# Patient Record
Sex: Male | Born: 1950 | Race: White | Hispanic: No | Marital: Married | Smoking: Never smoker
Health system: Southern US, Community
[De-identification: ages and names within clinical notes are randomized; demographics above are authoritative.]

## PROBLEM LIST (undated history)

## (undated) DIAGNOSIS — N529 Male erectile dysfunction, unspecified: Secondary | ICD-10-CM

## (undated) DIAGNOSIS — I10 Essential (primary) hypertension: Secondary | ICD-10-CM

## (undated) DIAGNOSIS — R911 Solitary pulmonary nodule: Secondary | ICD-10-CM

## (undated) DIAGNOSIS — Z9289 Personal history of other medical treatment: Secondary | ICD-10-CM

## (undated) DIAGNOSIS — I77810 Thoracic aortic ectasia: Secondary | ICD-10-CM

## (undated) DIAGNOSIS — E7800 Pure hypercholesterolemia, unspecified: Secondary | ICD-10-CM

## (undated) DIAGNOSIS — R079 Chest pain, unspecified: Secondary | ICD-10-CM

## (undated) DIAGNOSIS — I2510 Atherosclerotic heart disease of native coronary artery without angina pectoris: Secondary | ICD-10-CM

## (undated) DIAGNOSIS — I77819 Aortic ectasia, unspecified site: Secondary | ICD-10-CM

## (undated) DIAGNOSIS — M109 Gout, unspecified: Secondary | ICD-10-CM

## (undated) DIAGNOSIS — I452 Bifascicular block: Secondary | ICD-10-CM

## (undated) DIAGNOSIS — I700 Atherosclerosis of aorta: Secondary | ICD-10-CM

## (undated) HISTORY — DX: Atherosclerosis of aorta: I70.0

## (undated) HISTORY — DX: Personal history of other medical treatment: Z92.89

## (undated) HISTORY — DX: Gout, unspecified: M10.9

## (undated) HISTORY — DX: Aortic ectasia, unspecified site: I77.819

## (undated) HISTORY — DX: Male erectile dysfunction, unspecified: N52.9

## (undated) HISTORY — DX: Essential (primary) hypertension: I10

## (undated) HISTORY — DX: Atherosclerotic heart disease of native coronary artery without angina pectoris: I25.10

## (undated) HISTORY — DX: Thoracic aortic ectasia: I77.810

## (undated) HISTORY — DX: Solitary pulmonary nodule: R91.1

## (undated) HISTORY — DX: Chest pain, unspecified: R07.9

## (undated) HISTORY — DX: Pure hypercholesterolemia, unspecified: E78.00

## (undated) HISTORY — DX: Bifascicular block: I45.2

---

## 2006-05-30 ENCOUNTER — Ambulatory Visit (HOSPITAL_BASED_OUTPATIENT_CLINIC_OR_DEPARTMENT_OTHER): Admission: RE | Admit: 2006-05-30 | Discharge: 2006-05-30 | Payer: Self-pay | Admitting: Urology

## 2006-06-10 ENCOUNTER — Emergency Department (HOSPITAL_COMMUNITY): Admission: EM | Admit: 2006-06-10 | Discharge: 2006-06-10 | Payer: Self-pay | Admitting: Emergency Medicine

## 2013-09-29 ENCOUNTER — Other Ambulatory Visit: Payer: Self-pay | Admitting: Cardiology

## 2013-10-30 ENCOUNTER — Ambulatory Visit: Payer: Self-pay | Admitting: Cardiology

## 2013-11-17 ENCOUNTER — Encounter: Payer: Self-pay | Admitting: General Surgery

## 2013-11-17 DIAGNOSIS — I10 Essential (primary) hypertension: Secondary | ICD-10-CM

## 2013-11-24 ENCOUNTER — Encounter: Payer: Self-pay | Admitting: *Deleted

## 2013-12-16 ENCOUNTER — Ambulatory Visit (INDEPENDENT_AMBULATORY_CARE_PROVIDER_SITE_OTHER): Payer: Medicare Other | Admitting: Cardiology

## 2013-12-16 VITALS — BP 152/85 | HR 61 | Ht 72.0 in | Wt 279.4 lb

## 2013-12-16 DIAGNOSIS — I10 Essential (primary) hypertension: Secondary | ICD-10-CM

## 2013-12-16 NOTE — Patient Instructions (Signed)
Your physician recommends that you continue on your current medications as directed. Please refer to the Current Medication list given to you today.  Your physician wants you to follow-up in: 1 year with Dr. Turner. You will receive a reminder letter in the mail two months in advance. If you don't receive a letter, please call our office to schedule the follow-up appointment.  

## 2013-12-16 NOTE — Progress Notes (Signed)
  [address] [place], [place]  [address] Phone: [phone] Fax:  [phone]  Date:  12/16/2013   ID:  Anthony Malone, DOB May 29, 1950, MRN [id number]  PCP:  Darrow BussingKOIRALA,DIBAS, MD  Cardiologist:  Armanda Magicraci Turner, MD     History of Present Illness: Anthony Malone is a 63 y.o. male with history of HTN and chest pain in the past with normal cardiac workup who presents today for followup of HTN.  He is doing well.  He denies any chest pain, SOB, DOE, dizziness, palpitations or syncope.  Occasionally he will have some mild LE edema when he travels.   Wt Readings from Last 3 Encounters:  12/16/13 279 lb 6.4 oz (126.735 kg)     Past Medical History  Diagnosis Date  . Hypertension   . Gout   . High cholesterol   . ED (erectile dysfunction)   . History of EKG     LAFB  . Chest pain     Dr Mayford Knifeurner- normal echo-no ischemia on nuclear stress 6/12    Current Outpatient Prescriptions  Medication Sig Dispense Refill  . allopurinol (ZYLOPRIM) 300 MG tablet Take 300 mg by mouth daily.      Marland Kitchen. amLODipine (NORVASC) 5 MG tablet TAKE 1 TABLET DAILY  90 tablet  0  . aspirin 81 MG tablet Take 81 mg by mouth daily.      . hydrochlorothiazide (HYDRODIURIL) 25 MG tablet Take 25 mg by mouth daily.      Marland Kitchen. losartan (COZAAR) 100 MG tablet TAKE 1 TABLET DAILY  90 tablet  0  . metoprolol succinate (TOPROL-XL) 50 MG 24 hr tablet Take 50 mg by mouth daily. Take with or immediately following a meal.      . Multiple Vitamin (MULTIVITAMIN) tablet Take 1 tablet by mouth daily.      . naproxen (NAPROSYN) 500 MG tablet Take 500 mg by mouth 2 (two) times daily with a meal. As  Needed for joint pain with food       No current facility-administered medications for this visit.    Allergies:    Allergies  Allergen Reactions  . Ibuprofen Rash    High dosages causes rash    Social History:  The patient  reports that he has never smoked. He does not have any smokeless tobacco history on file. He reports  that he drinks alcohol. He reports that he does not use illicit drugs.   Family History:  The patient's family history includes Breast cancer in his mother; CVA in his father; Hypertension in his father and mother.   ROS:  Please see the history of present illness.      All other systems reviewed and negative.   PHYSICAL EXAM: VS:  BP 152/85  Pulse 61  Ht 6' (1.829 m)  Wt 279 lb 6.4 oz (126.735 kg)  BMI 37.89 kg/m2 Well nourished, well developed, in no acute distress HEENT: normal Neck: no JVD Cardiac:  normal S1, S2; RRR; no murmur Lungs:  clear to auscultation bilaterally, no wheezing, rhonchi or rales Abd: soft, nontender, no hepatomegaly Ext: no edema Skin: warm and dry Neuro:  CNs 2-12 intact, no focal abnormalities noted  EKG:    NSR with LAFB  ASSESSMENT AND PLAN:  1. HTN elevated today but this am at home was 137/4275mmHg.  It usually runs 120/70's at home. - continue amlodipine/Losartan/metoprolol/HCTZ  Followup with me in 1 year   Signed, Armanda Magicraci Turner, MD 12/16/2013 8:26 AM

## 2013-12-28 ENCOUNTER — Other Ambulatory Visit: Payer: Self-pay | Admitting: Cardiology

## 2014-01-12 ENCOUNTER — Other Ambulatory Visit: Payer: Self-pay | Admitting: Cardiology

## 2014-06-20 ENCOUNTER — Other Ambulatory Visit: Payer: Self-pay | Admitting: Cardiology

## 2014-09-18 ENCOUNTER — Encounter: Payer: Self-pay | Admitting: Cardiology

## 2014-12-24 ENCOUNTER — Encounter: Payer: Self-pay | Admitting: Cardiology

## 2014-12-24 ENCOUNTER — Ambulatory Visit (INDEPENDENT_AMBULATORY_CARE_PROVIDER_SITE_OTHER): Payer: Medicare Other | Admitting: Cardiology

## 2014-12-24 VITALS — BP 140/82 | HR 50 | Ht 73.0 in | Wt 284.8 lb

## 2014-12-24 DIAGNOSIS — R0602 Shortness of breath: Secondary | ICD-10-CM | POA: Diagnosis not present

## 2014-12-24 DIAGNOSIS — I10 Essential (primary) hypertension: Secondary | ICD-10-CM | POA: Diagnosis not present

## 2014-12-24 DIAGNOSIS — T733XXD Exhaustion due to excessive exertion, subsequent encounter: Secondary | ICD-10-CM | POA: Diagnosis not present

## 2014-12-24 DIAGNOSIS — T733XXA Exhaustion due to excessive exertion, initial encounter: Secondary | ICD-10-CM | POA: Insufficient documentation

## 2014-12-24 NOTE — Patient Instructions (Signed)
Medication Instructions:  Your physician recommends that you continue on your current medications as directed. Please refer to the Current Medication list given to you today.   Labwork: None  Testing/Procedures: Dr. Turner recommends you have an EXERCISE MYOVIEW.  Follow-Up: Your physician wants you to follow-up in: 1 year with Dr. Turner. You will receive a reminder letter in the mail two months in advance. If you don't receive a letter, please call our office to schedule the follow-up appointment.   Any Other Special Instructions Will Be Listed Below (If Applicable).   

## 2014-12-24 NOTE — Progress Notes (Signed)
Cardiology Office Note   Date:  12/24/2014   ID:  Anthony Malone, DOB 27-Jul-1950, MRN [id number]  PCP:  Darrow Bussing, MD    Chief Complaint  Patient presents with  . Follow-up    HTN      History of Present Illness:  64 y.o. male with history of HTN and chest pain in the past with normal cardiac workup who presents today for followup of HTN. He is doing well. He denies any chest pain, dizziness, palpitations or syncope. Occasionally he will have some mild LE edema when he travels.  He says that sometimes when he is outside working, when he comes in he will feel very fatigued and will feel a lump in his throat. He also gets DOE with exertion.      Past Medical History  Diagnosis Date  . Hypertension   . Gout   . High cholesterol   . ED (erectile dysfunction)   . History of EKG     LAFB  . Chest pain     Dr Mayford Knife- normal echo-no ischemia on nuclear stress 6/12    No past surgical history on file.   Current Outpatient Prescriptions  Medication Sig Dispense Refill  . allopurinol (ZYLOPRIM) 300 MG tablet Take 300 mg by mouth daily.    Marland Kitchen amLODipine (NORVASC) 5 MG tablet TAKE 1 TABLET DAILY 90 tablet 1  . aspirin 81 MG tablet Take 81 mg by mouth daily.    . hydrochlorothiazide (HYDRODIURIL) 25 MG tablet Take 25 mg by mouth daily.    Marland Kitchen losartan (COZAAR) 100 MG tablet TAKE 1 TABLET DAILY 90 tablet 1  . metoprolol succinate (TOPROL-XL) 50 MG 24 hr tablet Take 50 mg by mouth daily. Take with or immediately following a meal.    . Multiple Vitamin (MULTIVITAMIN) tablet Take 1 tablet by mouth daily.    . naproxen (NAPROSYN) 500 MG tablet Take 500 mg by mouth as needed. As  Needed for joint pain with food     No current facility-administered medications for this visit.    Allergies:   Ibuprofen    Social History:  The patient  reports that he has never smoked. He does not have any smokeless tobacco history on file. He reports that he drinks alcohol. He  reports that he does not use illicit drugs.   Family History:  The patient's family history includes Breast cancer in his mother; CVA in his father; Hypertension in his father and mother.    ROS:  Please see the history of present illness.   Otherwise, review of systems are positive for none.   All other systems are reviewed and negative.    PHYSICAL EXAM: VS:  BP 140/82 mmHg  Pulse 50  Ht 6\' 1"  (1.854 m)  Wt 284 lb 12.8 oz (129.184 kg)  BMI 37.58 kg/m2  SpO2 96% , BMI Body mass index is 37.58 kg/(m^2). GEN: Well nourished, well developed, in no acute distress HEENT: normal Neck: no JVD, carotid bruits, or masses Cardiac: RRR; no murmurs, rubs, or gallops,no edema  Respiratory:  clear to auscultation bilaterally, normal work of breathing GI: soft, nontender, nondistended, + BS MS: no deformity or atrophy Skin: warm and dry, no rash Neuro:  Strength and sensation are intact Psych: euthymic mood, full affect   EKG:  EKG is ordered today. The ekg ordered today demonstrates sinus bradycardia at 50bpm with LAD, LAFB and  nonspecific IVCD   Recent Labs: No results found for requested labs within last 365 days.    Lipid Panel No results found for: CHOL, TRIG, HDL, CHOLHDL, VLDL, LDLCALC, LDLDIRECT    Wt Readings from Last 3 Encounters:  12/24/14 284 lb 12.8 oz (129.184 kg)  12/16/13 279 lb 6.4 oz (126.735 kg)      ASSESSMENT AND PLAN:  1. HTN - controlled - continue amlodipine/Losartan/metoprolol/HCTZ 2.  DOE which could be related to obesity 3.  Exertional fatigue - I will get a stress myoview to rule out ischemia  Current medicines are reviewed at length with the patient today.  The patient does not have concerns regarding medicines.  The following changes have been made:  no change  Labs/ tests ordered today: See above Assessment and Plan  Orders Placed This Encounter  Procedures  . Myocardial Perfusion Imaging  . EKG 12-Lead     Disposition:   FU with me  in 1 year  Signed, Quintella Reichert, MD  12/24/2014 9:01 AM    [facility] [address] [place], [place], [place]  [address] Phone: [phone]; Fax: [phone]

## 2014-12-30 ENCOUNTER — Telehealth (HOSPITAL_COMMUNITY): Payer: Self-pay

## 2014-12-30 ENCOUNTER — Telehealth (HOSPITAL_COMMUNITY): Payer: Self-pay | Admitting: *Deleted

## 2014-12-30 NOTE — Telephone Encounter (Signed)
Patient given detailed instructions per Myocardial Perfusion Study Information Sheet for test on 01/05/15 at 0930. Patient Notified to arrive 15 minutes early, and that it is imperative to arrive on time for appointment to keep from having the test rescheduled. Patient verbalized understanding. Tekela Garguilo, Adelene Idler

## 2014-12-30 NOTE — Telephone Encounter (Signed)
Left message on voicemail in reference to upcoming appointment scheduled for 01-05-2015. Phone number given for a call back so details instructions can be given. Anthony Malone A   

## 2015-01-05 ENCOUNTER — Ambulatory Visit (HOSPITAL_COMMUNITY): Payer: Medicare Other | Attending: Cardiology

## 2015-01-05 DIAGNOSIS — R0602 Shortness of breath: Secondary | ICD-10-CM | POA: Diagnosis present

## 2015-01-05 DIAGNOSIS — T733XXD Exhaustion due to excessive exertion, subsequent encounter: Secondary | ICD-10-CM

## 2015-01-05 DIAGNOSIS — I517 Cardiomegaly: Secondary | ICD-10-CM | POA: Diagnosis not present

## 2015-01-05 DIAGNOSIS — I10 Essential (primary) hypertension: Secondary | ICD-10-CM | POA: Diagnosis not present

## 2015-01-05 DIAGNOSIS — R002 Palpitations: Secondary | ICD-10-CM | POA: Insufficient documentation

## 2015-01-05 DIAGNOSIS — R5383 Other fatigue: Secondary | ICD-10-CM | POA: Insufficient documentation

## 2015-01-05 MED ORDER — REGADENOSON 0.4 MG/5ML IV SOLN
0.4000 mg | Freq: Once | INTRAVENOUS | Status: AC
  Administered 2015-01-05: 0.4 mg via INTRAVENOUS

## 2015-01-05 MED ORDER — TECHNETIUM TC 99M SESTAMIBI GENERIC - CARDIOLITE
32.9000 | Freq: Once | INTRAVENOUS | Status: AC | PRN
  Administered 2015-01-05: 32.9 via INTRAVENOUS

## 2015-01-06 ENCOUNTER — Ambulatory Visit (HOSPITAL_COMMUNITY): Payer: Medicare Other | Attending: Cardiology

## 2015-01-06 DIAGNOSIS — R0989 Other specified symptoms and signs involving the circulatory and respiratory systems: Secondary | ICD-10-CM

## 2015-01-06 LAB — MYOCARDIAL PERFUSION IMAGING
CHL CUP NUCLEAR SSS: 4
CSEPPHR: 96 {beats}/min
LHR: 0.35
LVDIAVOL: 128 mL
LVSYSVOL: 59 mL
NUC STRESS TID: 0.75
Rest HR: 81 {beats}/min
SDS: 1
SRS: 3

## 2015-01-06 MED ORDER — TECHNETIUM TC 99M SESTAMIBI GENERIC - CARDIOLITE
32.5000 | Freq: Once | INTRAVENOUS | Status: AC | PRN
  Administered 2015-01-06: 33 via INTRAVENOUS

## 2015-02-09 ENCOUNTER — Other Ambulatory Visit: Payer: Self-pay | Admitting: Cardiology

## 2015-11-26 ENCOUNTER — Other Ambulatory Visit: Payer: Self-pay | Admitting: *Deleted

## 2015-11-26 MED ORDER — LOSARTAN POTASSIUM 100 MG PO TABS: 100.0000 mg | ORAL_TABLET | Freq: Every day | ORAL | 0 refills | Status: DC

## 2015-11-26 MED ORDER — AMLODIPINE BESYLATE 5 MG PO TABS: 5.0000 mg | ORAL_TABLET | Freq: Every day | ORAL | 0 refills | Status: DC

## 2015-12-07 ENCOUNTER — Other Ambulatory Visit: Payer: Self-pay

## 2015-12-07 MED ORDER — LOSARTAN POTASSIUM 100 MG PO TABS: 100.0000 mg | ORAL_TABLET | Freq: Every day | ORAL | 0 refills | Status: DC

## 2015-12-07 MED ORDER — AMLODIPINE BESYLATE 5 MG PO TABS: 5.0000 mg | ORAL_TABLET | Freq: Every day | ORAL | 0 refills | Status: DC

## 2016-01-10 ENCOUNTER — Other Ambulatory Visit: Payer: Self-pay | Admitting: Cardiology

## 2016-01-20 ENCOUNTER — Ambulatory Visit: Payer: Medicare Other | Admitting: Cardiology

## 2016-02-10 ENCOUNTER — Other Ambulatory Visit: Payer: Self-pay | Admitting: Cardiology

## 2016-02-14 ENCOUNTER — Encounter: Payer: Self-pay | Admitting: Cardiology

## 2016-02-14 ENCOUNTER — Ambulatory Visit (INDEPENDENT_AMBULATORY_CARE_PROVIDER_SITE_OTHER): Payer: Medicare Other | Admitting: Cardiology

## 2016-02-14 VITALS — BP 144/88 | HR 61 | Ht 73.0 in | Wt 300.4 lb

## 2016-02-14 DIAGNOSIS — Z7721 Contact with and (suspected) exposure to potentially hazardous body fluids: Secondary | ICD-10-CM | POA: Diagnosis not present

## 2016-02-14 DIAGNOSIS — I10 Essential (primary) hypertension: Secondary | ICD-10-CM

## 2016-02-14 NOTE — Patient Instructions (Signed)
Medication Instructions:  Your physician recommends that you continue on your current medications as directed. Please refer to the Current Medication list given to you today.   Labwork: TODAY  Testing/Procedures: None  Follow-Up: Your physician wants you to follow-up in: 1 year with Dr. Mayford Knifeurner. You will receive a reminder letter in the mail two months in advance. If you don't receive a letter, please call our office to schedule the follow-up appointment.   Any Other Special Instructions Will Be Listed Below (If Applicable).     If you need a refill on your cardiac medications before your next appointment, please call your pharmacy.

## 2016-02-14 NOTE — Progress Notes (Signed)
Cardiology Office Note    Date:  02/14/2016   ID:  Anthony Malone, DOB Dec 25, 1950, MRN [id number]  PCP:  Darrow BussingKOIRALA,DIBAS, MD  Cardiologist:  Armanda Magicraci Taylie Helder, MD   Chief Complaint  Patient presents with  . Hypertension    History of Present Illness:  Anthony Malone is a 65 y.o. male with history of HTN and chest pain in the past with normal cardiac workup who presents today for followup of HTN. He had a stress test a year ago for some exertional fatigue which was normal.  He is doing well. He denies any chest pain or pressure, dizziness, palpitations or syncope. Occasionally he will have some mild LE edema if he sits too much.  He has no DOE except if he runs real fast.     Past Medical History:  Diagnosis Date  . Chest pain    Dr Mayford Knifeurner- normal echo-no ischemia on nuclear stress 6/12  . ED (erectile dysfunction)   . Gout   . High cholesterol   . History of EKG    LAFB  . Hypertension     No past surgical history on file.  Current Medications: Outpatient Medications Prior to Visit  Medication Sig Dispense Refill  . allopurinol (ZYLOPRIM) 300 MG tablet Take 300 mg by mouth daily.    Marland Kitchen. amLODipine (NORVASC) 5 MG tablet TAKE ONE TABLET BY MOUTH ONCE DAILY 90 tablet 0  . aspirin 81 MG tablet Take 81 mg by mouth daily.    . hydrochlorothiazide (HYDRODIURIL) 25 MG tablet Take 25 mg by mouth daily.    Marland Kitchen. losartan (COZAAR) 100 MG tablet TAKE ONE TABLET BY MOUTH ONCE DAILY 90 tablet 0  . metoprolol succinate (TOPROL-XL) 50 MG 24 hr tablet Take 50 mg by mouth daily. Take with or immediately following a meal.    . naproxen (NAPROSYN) 500 MG tablet Take 500 mg by mouth as needed. As  Needed for joint pain with food    . Multiple Vitamin (MULTIVITAMIN) tablet Take 1 tablet by mouth daily.     No facility-administered medications prior to visit.      Allergies:   Ibuprofen   Social History   Social History  . Marital status: Married    Spouse name: N/A  . Number of children:  N/A  . Years of education: N/A   Social History Main Topics  . Smoking status: Never Smoker  . Smokeless tobacco: None  . Alcohol use Yes     Comment: rare  . Drug use: No  . Sexual activity: Not Asked   Other Topics Concern  . None   Social History Narrative  . None     Family History:  The patient's family history includes Breast cancer in his mother; CVA in his father; Hypertension in his father and mother.   ROS:   Please see the history of present illness.    ROS All other systems reviewed and are negative.  No flowsheet data found.     PHYSICAL EXAM:   VS:  BP (!) 144/88   Pulse 61   Ht 6\' 1"  (1.854 m)   Wt (!) 300 lb 6.4 oz (136.3 kg)   BMI 39.63 kg/m    GEN: Well nourished, well developed, in no acute distress  HEENT: normal  Neck: no JVD, carotid bruits, or masses Cardiac: RRR; no murmurs, rubs, or gallops,no edema.  Intact distal pulses bilaterally.  Respiratory:  clear to auscultation bilaterally, normal work of breathing GI: soft, nontender,  nondistended, + BS MS: no deformity or atrophy  Skin: warm and dry, no rash Neuro:  Alert and Oriented x 3, Strength and sensation are intact Psych: euthymic mood, full affect  Wt Readings from Last 3 Encounters:  02/14/16 (!) 300 lb 6.4 oz (136.3 kg)  01/05/15 284 lb (128.8 kg)  12/24/14 284 lb 12.8 oz (129.2 kg)      Studies/Labs Reviewed:   EKG:  EKG is ordered today.  The ekg ordered today demonstrates NSR with no ST changes  Recent Labs: No results found for requested labs within last 8760 hours.   Lipid Panel No results found for: CHOL, TRIG, HDL, CHOLHDL, VLDL, LDLCALC, LDLDIRECT  Additional studies/ records that were reviewed today include:  none    ASSESSMENT:    1. Essential hypertension, benign      PLAN:  In order of problems listed above:  1. HTN- BP controlled on current meds.  Continue Amlodipine/diuretic/ARB and BB.  Patient was noted to have a cut on his leg with  significant bleeding that I was unaware of at time of examination and had contact with blood.  Explained to patient that due to blood exposure we would need to check a Hepatitis C and HIV panel on him to rule out blood borne pathogens.  Patient agrees to proceed.   Medication Adjustments/Labs and Tests Ordered: Current medicines are reviewed at length with the patient today.  Concerns regarding medicines are outlined above.  Medication changes, Labs and Tests ordered today are listed in the Patient Instructions below.  There are no Patient Instructions on file for this visit.   Signed, Armanda Magic, MD  02/14/2016 2:54 PM    [facility] [address] [place], [place], [place]  [address] Phone: [phone]; Fax: [phone]

## 2016-02-15 LAB — HEPATITIS C ANTIBODY: HCV Ab: NEGATIVE

## 2016-02-15 LAB — HIV ANTIBODY (ROUTINE TESTING W REFLEX): HIV 1&2 Ab, 4th Generation: NONREACTIVE

## 2016-05-10 DIAGNOSIS — Z0001 Encounter for general adult medical examination with abnormal findings: Secondary | ICD-10-CM | POA: Diagnosis not present

## 2016-05-10 DIAGNOSIS — E7800 Pure hypercholesterolemia, unspecified: Secondary | ICD-10-CM | POA: Diagnosis not present

## 2016-05-10 DIAGNOSIS — M109 Gout, unspecified: Secondary | ICD-10-CM | POA: Diagnosis not present

## 2016-05-10 DIAGNOSIS — Z125 Encounter for screening for malignant neoplasm of prostate: Secondary | ICD-10-CM | POA: Diagnosis not present

## 2016-05-10 DIAGNOSIS — Z6841 Body Mass Index (BMI) 40.0 and over, adult: Secondary | ICD-10-CM | POA: Diagnosis not present

## 2016-05-10 DIAGNOSIS — I10 Essential (primary) hypertension: Secondary | ICD-10-CM | POA: Diagnosis not present

## 2016-05-10 DIAGNOSIS — Z79899 Other long term (current) drug therapy: Secondary | ICD-10-CM | POA: Diagnosis not present

## 2016-05-15 ENCOUNTER — Other Ambulatory Visit: Payer: Self-pay | Admitting: Cardiology

## 2016-05-25 DIAGNOSIS — Z1211 Encounter for screening for malignant neoplasm of colon: Secondary | ICD-10-CM | POA: Diagnosis not present

## 2016-08-13 ENCOUNTER — Other Ambulatory Visit: Payer: Self-pay | Admitting: Cardiology

## 2016-11-21 ENCOUNTER — Emergency Department (HOSPITAL_COMMUNITY)
Admission: EM | Admit: 2016-11-21 | Discharge: 2016-11-21 | Disposition: A | Discharge status: Home / Self Care | Payer: Medicare Other | Attending: Emergency Medicine | Admitting: Emergency Medicine

## 2016-11-21 ENCOUNTER — Emergency Department (HOSPITAL_COMMUNITY): Payer: Medicare Other

## 2016-11-21 ENCOUNTER — Encounter (HOSPITAL_COMMUNITY): Payer: Self-pay | Admitting: Emergency Medicine

## 2016-11-21 DIAGNOSIS — S91212A Laceration without foreign body of left great toe with damage to nail, initial encounter: Secondary | ICD-10-CM | POA: Insufficient documentation

## 2016-11-21 DIAGNOSIS — Z23 Encounter for immunization: Secondary | ICD-10-CM | POA: Diagnosis not present

## 2016-11-21 DIAGNOSIS — Y939 Activity, unspecified: Secondary | ICD-10-CM | POA: Diagnosis not present

## 2016-11-21 DIAGNOSIS — Y929 Unspecified place or not applicable: Secondary | ICD-10-CM | POA: Diagnosis not present

## 2016-11-21 DIAGNOSIS — Z79899 Other long term (current) drug therapy: Secondary | ICD-10-CM | POA: Diagnosis not present

## 2016-11-21 DIAGNOSIS — Y999 Unspecified external cause status: Secondary | ICD-10-CM | POA: Insufficient documentation

## 2016-11-21 DIAGNOSIS — I10 Essential (primary) hypertension: Secondary | ICD-10-CM | POA: Insufficient documentation

## 2016-11-21 DIAGNOSIS — S99929A Unspecified injury of unspecified foot, initial encounter: Secondary | ICD-10-CM

## 2016-11-21 DIAGNOSIS — Z7982 Long term (current) use of aspirin: Secondary | ICD-10-CM | POA: Insufficient documentation

## 2016-11-21 DIAGNOSIS — W228XXA Striking against or struck by other objects, initial encounter: Secondary | ICD-10-CM | POA: Insufficient documentation

## 2016-11-21 DIAGNOSIS — S97112A Crushing injury of left great toe, initial encounter: Secondary | ICD-10-CM | POA: Diagnosis present

## 2016-11-21 DIAGNOSIS — S99921A Unspecified injury of right foot, initial encounter: Secondary | ICD-10-CM | POA: Diagnosis not present

## 2016-11-21 DIAGNOSIS — S91112A Laceration without foreign body of left great toe without damage to nail, initial encounter: Secondary | ICD-10-CM | POA: Diagnosis not present

## 2016-11-21 MED ORDER — TETANUS-DIPHTH-ACELL PERTUSSIS 5-2.5-18.5 LF-MCG/0.5 IM SUSP
0.5000 mL | Freq: Once | INTRAMUSCULAR | Status: AC
  Administered 2016-11-21: 0.5 mL via INTRAMUSCULAR
  Filled 2016-11-21: qty 0.5

## 2016-11-21 MED ORDER — LIDOCAINE HCL (PF) 1 % IJ SOLN
5.0000 mL | Freq: Once | INTRAMUSCULAR | Status: DC
  Filled 2016-11-21: qty 5

## 2016-11-21 NOTE — ED Notes (Signed)
See edp assessment 

## 2016-11-21 NOTE — ED Notes (Signed)
Patient transported to X-ray 

## 2016-11-21 NOTE — ED Provider Notes (Signed)
[facility]-EMERGENCY DEPT Provider Note   By signing my name below, I, Earmon Phoenix, attest that this documentation has been prepared under the direction and in the presence of [place] Russo, PA-C. Electronically Signed: Earmon Phoenix, ED Scribe. 11/21/16. 1:56 PM    History   Chief Complaint Chief Complaint  Patient presents with  . Toe Injury    The history is provided by the patient and medical records. No language interpreter was used.    Anthony Malone is an obese 66 y.o. male who presents to the Emergency Department complaining of a laceration to the left great toe that occurred about three hours ago. He reports associated bleeding that has been controlled. He states his toenail is loose due to the laceration. Pt states something fell on the toe which he inadvertently jerked the foot back. He has not taken anything for pain and rates it at 0/10. He denies any modifying factors. He denies difficulty moving the toe, bruising, numbness, tingling or weakness of the left toes or foot. He denies pain. His last tetanus vaccination was about 5 years ago. He takes a daily ASA but denies any other anticoagulant therapy. Denies hx of DM or immunocompromise.   Past Medical History:  Diagnosis Date  . Chest pain    Dr Mayford Knife- normal echo-no ischemia on nuclear stress 6/12  . ED (erectile dysfunction)   . Gout   . High cholesterol   . History of EKG    LAFB  . Hypertension     Patient Active Problem List   Diagnosis Date Noted  . SOB (shortness of breath) 12/24/2014  . Essential hypertension, benign 11/17/2013    History reviewed. No pertinent surgical history.     Home Medications    Prior to Admission medications   Medication Sig Start Date End Date Taking? Authorizing Provider  allopurinol (ZYLOPRIM) 300 MG tablet Take 300 mg by mouth daily.    [provider]  amLODipine (NORVASC) 5 MG tablet TAKE ONE TABLET BY MOUTH ONCE DAILY 05/16/16   Quintella Reichert, MD    aspirin 81 MG tablet Take 81 mg by mouth daily.    [provider]  hydrochlorothiazide (HYDRODIURIL) 25 MG tablet Take 25 mg by mouth daily.    [provider]  losartan (COZAAR) 100 MG tablet TAKE ONE TABLET BY MOUTH ONCE DAILY 08/14/16   Quintella Reichert, MD  metoprolol succinate (TOPROL-XL) 50 MG 24 hr tablet Take 50 mg by mouth daily. Take with or immediately following a meal.    [provider]  naproxen (NAPROSYN) 500 MG tablet Take 500 mg by mouth as needed. As  Needed for joint pain with food    [provider]    Family History Family History  Problem Relation Age of Onset  . Hypertension Mother   . Breast cancer Mother   . Hypertension Father   . CVA Father     Social History Social History  Substance Use Topics  . Smoking status: Never Smoker  . Smokeless tobacco: Never Used  . Alcohol use Yes     Comment: rare     Allergies   Ibuprofen   Review of Systems Review of Systems  Musculoskeletal: Negative for arthralgias, joint swelling and myalgias.  Skin: Positive for wound. Negative for color change.  Neurological: Negative for weakness and numbness.     Physical Exam Updated Vital Signs BP (!) 142/79 (BP Location: Right Arm)   Pulse 64   Temp 98.2 F (36.8  C) (Oral)   Resp 18   Ht 6\' 1"  (1.854 m)   Wt 290 lb 12.8 oz (131.9 kg)   BMI 38.37 kg/m   Physical Exam  Constitutional: He appears well-developed and well-nourished. No distress.  HENT:  Head: Normocephalic and atraumatic.  Eyes: Conjunctivae are normal.  Cardiovascular: Normal rate and intact distal pulses.   Pulmonary/Chest: Effort normal.  Musculoskeletal:  Normal ROM of toe. NV intact  Skin:  Left great toe with laceration at base of nail exposing nail matrix, extending along lateral border of nail to distal and. Nail matrix appears to be displaced out of skin. Nail appears still attached to nail bed.   Psychiatric: He has a normal mood and affect. His  behavior is normal.  Nursing note and vitals reviewed.    ED Treatments / Results  COORDINATION OF CARE: 12:08 PM- Will order imaging and update tetanus vaccination. Pt verbalizes understanding and agrees to plan.  Medications  Tdap (BOOSTRIX) injection 0.5 mL (0.5 mLs Intramuscular Given 11/21/16 1242)    Labs (all labs ordered are listed, but only abnormal results are displayed) Labs Reviewed - No data to display  EKG  EKG Interpretation None       Radiology Dg Toe Great Left  Result Date: 11/21/2016 CLINICAL DATA:  66 year old who dropped a heavy object on the the left great toe earlier today. Nail bed injury with bleeding. Initial encounter. EXAM: LEFT GREAT TOE 3 VIEWS COMPARISON:  None. FINDINGS: Nail bed injury. No evidence of acute fracture or dislocation. Mild narrowing of the first MTP joint space and the IP joint space of the great toe. Benign cystic lesion in the medial aspect of the head of the proximal phalanx. IMPRESSION: 1. No acute osseous abnormality. 2. Mild osteoarthritis. Electronically Signed   By: Hulan Saashomas  Lawrence M.D.   On: 11/21/2016 12:42    Procedures .Marland Kitchen.Laceration Repair Date/Time: 11/21/2016 1:41 PM Performed by: RUSSO, [place]ORDAN N Authorized by: RUSSO, [place]ORD[place] N   Consent:    Consent obtained:  Verbal   Consent given by:  Patient   Risks discussed:  Infection, pain, poor cosmetic result, poor wound healing and need for additional repair   Alternatives discussed:  No treatment Anesthesia (see MAR for exact dosages):    Anesthesia method:  None Laceration details:    Location:  Toe   Toe location:  L big toe Repair type:    Repair type:  Simple Pre-procedure details:    Preparation:  Patient was prepped and draped in usual sterile fashion and imaging obtained to evaluate for foreign bodies Exploration:    Hemostasis achieved with:  Direct pressure   Wound exploration: wound explored through full range of motion and entire depth of wound probed  and visualized     Wound extent: no foreign bodies/material noted, no tendon damage noted and no underlying fracture noted     Contaminated: no   Treatment:    Area cleansed with:  Saline   Amount of cleaning:  Standard   Irrigation solution:  Sterile saline   Irrigation method:  Syringe   Visualized foreign bodies/material removed: no   Skin repair:    Repair method:  Tissue adhesive Approximation:    Vermilion border: well-aligned   Post-procedure details:    Dressing:  Non-adherent dressing (post-op boot)   Patient tolerance of procedure:  Tolerated well, no immediate complications Comments:     Base of nail/nail matrix was replaced under skin in proper position with good alignment, with the assistance  of Dr. Azalia Bilis.      (including critical care time)  Medications Ordered in ED Medications  Tdap (BOOSTRIX) injection 0.5 mL (0.5 mLs Intramuscular Given 11/21/16 1242)     Initial Impression / Assessment and Plan / ED Course  I have reviewed the triage vital signs and the nursing notes.  Pertinent labs & imaging results that were available during my care of the patient were reviewed by me and considered in my medical decision making (see chart for details).     Patient with laceration to left great toe with involvement of the nail. Base of nail is protruding from skin. X-ray negative for acute fracture or retained foreign body. Pressure irrigation performed. Wound explored and base of wound visualized in a bloodless field without evidence of foreign body.  Nail matrix/base of nail was replaced under scan in proper position with good alignment. Dermabond applied around border of nail, over laceration. Laceration occurred < 8 hours prior to repair which was well tolerated.  Tdap updated.  Pt has no comorbidities to effect normal wound healing. Pt discharged without antibiotics. Postop boot given for protection. Discussed dermabond home care with patient and answered  questions. Pt to follow-up with podiatry.; they are to return to the ED sooner for signs of infection. Pt is hemodynamically stable with no complaints prior to dc.   Patient discussed with and seen by Dr. Patria Mane.  Discussed results, findings, treatment and follow up. Patient advised of return precautions. Patient verbalized understanding and agreed with plan.   Final Clinical Impressions(s) / ED Diagnoses   Final diagnoses:  Toe injury  Laceration of left great toe without foreign body with damage to nail, initial encounter    New Prescriptions Discharge Medication List as of 11/21/2016  1:56 PM     I personally performed the services described in this documentation, which was scribed in my presence. The recorded information has been reviewed and is accurate.     Russo, [place] N, PA-C 11/21/16 Dominic Pea, MD 11/21/16 [phone]

## 2016-11-21 NOTE — ED Triage Notes (Signed)
Pt states he cut his left big toe moving furniture. Bleeding controlled.

## 2016-11-21 NOTE — Discharge Instructions (Signed)
Please read instructions below.  Keep your wound clean and covered. You can take naproxen as needed for pain. Apply ice to your toe for 15 minutes at a time and elevate it when possible. Follow up with the foot specialist, Podiatrist, for wound recheck in 3 days.  Return to the ER for fever, pus draining from wound, redness, or new or worsening symptoms.

## 2016-11-24 DIAGNOSIS — M109 Gout, unspecified: Secondary | ICD-10-CM | POA: Diagnosis not present

## 2016-11-24 DIAGNOSIS — E7800 Pure hypercholesterolemia, unspecified: Secondary | ICD-10-CM | POA: Diagnosis not present

## 2016-11-24 DIAGNOSIS — I10 Essential (primary) hypertension: Secondary | ICD-10-CM | POA: Diagnosis not present

## 2016-12-01 ENCOUNTER — Ambulatory Visit: Payer: Medicare Other

## 2016-12-01 ENCOUNTER — Ambulatory Visit (INDEPENDENT_AMBULATORY_CARE_PROVIDER_SITE_OTHER): Payer: Medicare Other | Admitting: Podiatry

## 2016-12-01 ENCOUNTER — Encounter: Payer: Self-pay | Admitting: Podiatry

## 2016-12-01 DIAGNOSIS — L600 Ingrowing nail: Secondary | ICD-10-CM

## 2016-12-01 DIAGNOSIS — S99922A Unspecified injury of left foot, initial encounter: Secondary | ICD-10-CM

## 2016-12-01 DIAGNOSIS — L601 Onycholysis: Secondary | ICD-10-CM | POA: Diagnosis not present

## 2016-12-01 NOTE — Progress Notes (Signed)
   Subjective:    Patient ID: Jerrel IvoryRonald S Gertsch, male    DOB: Jun 12, 1950, 66 y.o.   MRN: [id number]  HPI Chief Complaint  Patient presents with  . Nail Problem    Left foot; great toe; pt stated, "Dropped a trailor on foot a week ago; tried to pull nail off"      Review of Systems  All other systems reviewed and are negative.      Objective:   Physical Exam        Assessment & Plan:

## 2016-12-01 NOTE — Patient Instructions (Addendum)

## 2016-12-04 NOTE — Progress Notes (Signed)
Subjective:    Patient ID: Anthony Malone, male   DOB: 66 y.o.   MRN: [id number]   HPI patient presents with damaged hallux nail left stating that the nail is half off and sore    Review of Systems  All other systems reviewed and are negative.       Objective:  Physical Exam  Cardiovascular: Intact distal pulses.   Musculoskeletal: Normal range of motion.  Neurological: He is alert.  Skin: Skin is warm.  Nursing note and vitals reviewed.  neurovascular status intact muscle strength adequate range of motion within normal limits with patient noted to have a damaged hallux nail left with dorsal swelling and localized erythema edema with no active drainage noted     Assessment:    Severe damage left hallux nail with localized redness erythema edema     Plan:    H&P condition reviewed and recommended nail removal explaining procedure and risk and the fact there is no guarantee as to what will regrow. Patient wants surgery and today I infiltrated the left hallux 60 Milligan's I can Marcaine mixture remove the nail flushed out the bed applied sterile dressing and instructed on soaks. Reappoint to recheck again

## 2016-12-13 ENCOUNTER — Ambulatory Visit: Payer: Self-pay | Admitting: Podiatry

## 2016-12-22 DIAGNOSIS — L570 Actinic keratosis: Secondary | ICD-10-CM | POA: Diagnosis not present

## 2016-12-22 DIAGNOSIS — Q828 Other specified congenital malformations of skin: Secondary | ICD-10-CM | POA: Diagnosis not present

## 2016-12-22 DIAGNOSIS — D485 Neoplasm of uncertain behavior of skin: Secondary | ICD-10-CM | POA: Diagnosis not present

## 2016-12-31 ENCOUNTER — Other Ambulatory Visit: Payer: Self-pay | Admitting: Cardiology

## 2017-03-15 ENCOUNTER — Ambulatory Visit: Payer: Medicare Other | Admitting: Cardiology

## 2017-03-15 ENCOUNTER — Encounter: Payer: Self-pay | Admitting: Cardiology

## 2017-03-15 VITALS — BP 142/84 | HR 87 | Ht 73.0 in | Wt 295.2 lb

## 2017-03-15 DIAGNOSIS — I10 Essential (primary) hypertension: Secondary | ICD-10-CM

## 2017-03-15 DIAGNOSIS — E7800 Pure hypercholesterolemia, unspecified: Secondary | ICD-10-CM

## 2017-03-15 DIAGNOSIS — E785 Hyperlipidemia, unspecified: Secondary | ICD-10-CM | POA: Insufficient documentation

## 2017-03-15 MED ORDER — AMLODIPINE BESYLATE 5 MG PO TABS: ORAL_TABLET | ORAL | 3 refills | Status: DC

## 2017-03-15 MED ORDER — AMLODIPINE BESYLATE 10 MG PO TABS: 10.0000 mg | ORAL_TABLET | Freq: Every morning | ORAL | 3 refills | Status: DC

## 2017-03-15 NOTE — Patient Instructions (Addendum)
Medication Instructions:  Your physician has recommended you make the following change in your medication:   INCREASE: Amlodipine to 10 mg ever morning.   Labwork: None ordered   Testing/Procedures: None ordered   Follow-Up: Your physician wants you to follow-up in: 1 year with Dr. Mayford Knifeurner. You will receive a reminder letter in the mail two months in advance. If you don't receive a letter, please call our office to schedule the follow-up appointment.  Any Other Special Instructions Will Be Listed Below (If Applicable).     If you need a refill on your cardiac medications before your next appointment, please call your pharmacy.

## 2017-03-15 NOTE — Progress Notes (Signed)
Cardiology Office Note:    Date:  03/15/2017   ID:  Anthony Malone, DOB Mar 28, 1951, MRN [id number]  PCP:  Darrow BussingKoirala, Dibas, MD  Cardiologist:  Armanda Magicraci Jasma Seevers, MD   Referring MD: Darrow BussingKoirala, Dibas, MD   Chief Complaint  Patient presents with  . Hypertension    History of Present Illness:    Anthony IvoryRonald S Malone is a 66 y.o. male with a hx of HTN and chest pain in the past with normal cardiac workup who presents today for followup of HTN. He had a stress test a while back for some exertional fatigue which was normal.  He is here today for followup and is doing well.  He denies any chest pain or pressure, SOB, DOE, PND, orthopnea, dizziness, palpitations or syncope. He stopped taking his metoprolol and since then his LE edema and cold toes have resolved.  He is compliant with his meds and is tolerating meds with no SE.     Past Medical History:  Diagnosis Date  . Chest pain    Dr Mayford Knifeurner- normal echo-no ischemia on nuclear stress 6/12  . ED (erectile dysfunction)   . Gout   . High cholesterol   . History of EKG    LAFB  . Hypertension     No past surgical history on file.  Current Medications: Current Meds  Medication Sig  . allopurinol (ZYLOPRIM) 300 MG tablet Take 300 mg by mouth daily.  Marland Kitchen. amLODipine (NORVASC) 5 MG tablet Take 5 mg 2 (two) times daily by mouth.  Marland Kitchen. aspirin 81 MG tablet Take 81 mg by mouth daily.  . hydrochlorothiazide (HYDRODIURIL) 25 MG tablet Take 25 mg by mouth daily.  Marland Kitchen. losartan (COZAAR) 100 MG tablet TAKE 1 TABLET BY MOUTH ONCE DAILY  . naproxen (NAPROSYN) 500 MG tablet Take 500 mg by mouth as needed. As  Needed for joint pain with food     Allergies:   Ibuprofen   Social History   Socioeconomic History  . Marital status: Married    Spouse name: None  . Number of children: None  . Years of education: None  . Highest education level: None  Social Needs  . Financial resource strain: None  . Food insecurity - worry: None  . Food insecurity - inability:  None  . Transportation needs - medical: None  . Transportation needs - non-medical: None  Occupational History  . None  Tobacco Use  . Smoking status: Never Smoker  . Smokeless tobacco: Never Used  Substance and Sexual Activity  . Alcohol use: Yes    Comment: rare  . Drug use: No  . Sexual activity: None  Other Topics Concern  . None  Social History Narrative  . None     Family History: The patient's family history includes Breast cancer in his mother; CVA in his father; Hypertension in his father and mother.  ROS:   Please see the history of present illness.    ROS  All other systems reviewed and negative.   EKGs/Labs/Other Studies Reviewed:    The following studies were reviewed today: none  EKG:  EKG is ordered today and showed NSR with PVCs and LAFB  Recent Labs: No results found for requested labs within last 8760 hours.   Recent Lipid Panel No results found for: CHOL, TRIG, HDL, CHOLHDL, VLDL, LDLCALC, LDLDIRECT  Physical Exam:    VS:  BP (!) 142/84   Pulse 87   Ht 6\' 1"  (1.854 m)   Wt 295 lb 3.2  oz (133.9 kg)   BMI 38.95 kg/m     Wt Readings from Last 3 Encounters:  03/15/17 295 lb 3.2 oz (133.9 kg)  11/21/16 290 lb 12.8 oz (131.9 kg)  02/14/16 (!) 300 lb 6.4 oz (136.3 kg)     GEN:  Well nourished, well developed in no acute distress HEENT: Normal NECK: No JVD; No carotid bruits LYMPHATICS: No lymphadenopathy CARDIAC: RRR, no murmurs, rubs, gallops RESPIRATORY:  Clear to auscultation without rales, wheezing or rhonchi  ABDOMEN: Soft, non-tender, non-distended MUSCULOSKELETAL:  No edema; No deformity  SKIN: Warm and dry NEUROLOGIC:  Alert and oriented x 3 PSYCHIATRIC:  Normal affect   ASSESSMENT:    1. Essential hypertension   2. Pure hypercholesterolemia    PLAN:    In order of problems listed above:  1.  HTN - BP is well controlled on exam today.  He will continue on losartan 100mg  daily, HCTZ 25mg  daily and amlodipine.  I instructed  him to change his amlodipine dosing to 10mg  daily in the am.  2.  Hyperlipidemia - followed by PCP.     Medication Adjustments/Labs and Tests Ordered: Current medicines are reviewed at length with the patient today.  Concerns regarding medicines are outlined above.  Orders Placed This Encounter  Procedures  . EKG 12-Lead   No orders of the defined types were placed in this encounter.   Signed, Armanda Magicraci Evangelynn Lochridge, MD  03/15/2017 8:02 AM    [facility]

## 2017-04-03 ENCOUNTER — Other Ambulatory Visit: Payer: Self-pay | Admitting: Cardiology

## 2017-06-07 DIAGNOSIS — Z0001 Encounter for general adult medical examination with abnormal findings: Secondary | ICD-10-CM | POA: Diagnosis not present

## 2017-06-07 DIAGNOSIS — I10 Essential (primary) hypertension: Secondary | ICD-10-CM | POA: Diagnosis not present

## 2017-06-07 DIAGNOSIS — M109 Gout, unspecified: Secondary | ICD-10-CM | POA: Diagnosis not present

## 2018-03-19 NOTE — Progress Notes (Signed)
Cardiology Office Note    Date:  03/20/2018   ID:  Anthony Malone, DOB 1950-12-08, MRN [id number]  PCP:  Darrow Bussing, MD  Cardiologist: Armanda Magic, MD EPS: None  No chief complaint on file.   History of Present Illness:  Anthony Malone is a 67 y.o. male history of hypertension, hyperlipidemia, history of exertional fatigue with normal stress test in 2016.  Patient comes in today for yearly f/u. BP up today. Usually at home it's mid 130-140's/80.Walks 3-5 miles daily with routine activities. Doesn't exercise on a regular basis.  LDL 112 06/2017.  Not  interested in taking anything for his cholesterol.  Stays busy going to antique car shows with his 2 cars.  Denies chest pain, palpitations, dizziness, or presyncope.   Past Medical History:  Diagnosis Date  . Chest pain    Dr Mayford Knife- normal echo-no ischemia on nuclear stress 6/12  . ED (erectile dysfunction)   . Gout   . High cholesterol   . History of EKG    LAFB  . Hypertension     No past surgical history on file.  Current Medications: Current Meds  Medication Sig  . allopurinol (ZYLOPRIM) 300 MG tablet Take 300 mg by mouth daily.  Marland Kitchen amLODipine (NORVASC) 5 MG tablet Take 5 mg by mouth 2 (two) times daily. Takes one tab in the morning and one tablet at bedtime.  Marland Kitchen aspirin 81 MG tablet Take 81 mg by mouth daily.  . hydrochlorothiazide (HYDRODIURIL) 25 MG tablet Take 25 mg by mouth daily.  Marland Kitchen losartan (COZAAR) 100 MG tablet TAKE 1 TABLET BY MOUTH ONCE DAILY  . naproxen (NAPROSYN) 500 MG tablet Take 500 mg by mouth as needed. As  Needed for joint pain with food  . [DISCONTINUED] amLODipine (NORVASC) 10 MG tablet Take 1 tablet (10 mg total) every morning by mouth.     Allergies:   Ibuprofen   Social History   Socioeconomic History  . Marital status: Married    Spouse name: Not on file  . Number of children: Not on file  . Years of education: Not on file  . Highest education level: Not on file  Occupational  History  . Not on file  Social Needs  . Financial resource strain: Not on file  . Food insecurity:    Worry: Not on file    Inability: Not on file  . Transportation needs:    Medical: Not on file    Non-medical: Not on file  Tobacco Use  . Smoking status: Never Smoker  . Smokeless tobacco: Never Used  Substance and Sexual Activity  . Alcohol use: Yes    Comment: rare  . Drug use: No  . Sexual activity: Not on file  Lifestyle  . Physical activity:    Days per week: Not on file    Minutes per session: Not on file  . Stress: Not on file  Relationships  . Social connections:    Talks on phone: Not on file    Gets together: Not on file    Attends religious service: Not on file    Active member of club or organization: Not on file    Attends meetings of clubs or organizations: Not on file    Relationship status: Not on file  Other Topics Concern  . Not on file  Social History Narrative  . Not on file     Family History:  The patient's family history includes Breast cancer in his mother;  CVA in his father; Hypertension in his father and mother.   ROS:   Please see the history of present illness.    Review of Systems  Constitution: Positive for malaise/fatigue.  HENT: Negative.   Cardiovascular: Negative.   Respiratory: Negative.   Endocrine: Negative.   Hematologic/Lymphatic: Negative.   Musculoskeletal: Positive for arthritis and joint pain.  Gastrointestinal: Negative.   Genitourinary: Negative.   Neurological: Negative.    All other systems reviewed and are negative.   PHYSICAL EXAM:   VS:  BP (!) 152/88   Pulse 82   Ht 6\' 1"  (1.854 m)   Wt 288 lb 3.2 oz (130.7 kg)   SpO2 97%   BMI 38.02 kg/m   Physical Exam  GEN: Obese, in no acute distress  Neck: no JVD, carotid bruits, or masses Cardiac:RRR; no murmurs, rubs, or gallops  Respiratory:  clear to auscultation bilaterally, normal work of breathing GI: soft, nontender, nondistended, + BS Ext: without  cyanosis, clubbing, or edema, Good distal pulses bilaterally Neuro:  Alert and Oriented x 3 Psych: euthymic mood, full affect  Wt Readings from Last 3 Encounters:  03/20/18 288 lb 3.2 oz (130.7 kg)  03/15/17 295 lb 3.2 oz (133.9 kg)  11/21/16 290 lb 12.8 oz (131.9 kg)      Studies/Labs Reviewed:   EKG:  EKG is ordered today.  The ekg ordered today demonstrates normal sinus rhythm with left anterior fascicular block and frequent PVCs  Recent Labs: No results found for requested labs within last 8760 hours.   Lipid Panel No results found for: CHOL, TRIG, HDL, CHOLHDL, VLDL, LDLCALC, LDLDIRECT  Additional studies/ records that were reviewed today include:   NST 12/2014 Study Highlights    Nuclear stress EF: 54%.  There was no ST segment deviation noted during stress.  The study is normal.  This is a low risk study.  The left ventricular ejection fraction is mildly decreased (45-54%).   This is a normal pharmacologic nuclear stress study with no evidence of prior infarct or ischemia. LVEF is low normal and the left ventricle appears mildly dilated.          ASSESSMENT:    1. Essential hypertension, benign   2. Mixed hyperlipidemia   3. PVC's (premature ventricular contractions)   4. Obesity (BMI 35.0-39.9 without comorbidity)      PLAN:  In order of problems listed above:  Essential hypertension blood pressure up on this visit but he brings readings from home and they are all stable.  Continue current medications.  He will continue to monitor at home and call us if it starts staying elevated.  2 g sodium diet.  Increase exercise to 30 minutes daily.  Follow-up with Dr. Mayford Knifeurner in 1 year.  Mixed hyperlipidemia LDL 112 in February.  Patient's not interested in taking any medications for this.  PVCs frequent on EKG.  Patient asymptomatic.  He wore a Holter monitor years ago that was normal he says.  Check surveillance labs today.  Obesity recommend at least 20 to  30 minutes of exercise daily and weight loss program.  Medication Adjustments/Labs and Tests Ordered: Current medicines are reviewed at length with the patient today.  Concerns regarding medicines are outlined above.  Medication changes, Labs and Tests ordered today are listed in the Patient Instructions below. Patient Instructions   Medication Instructions:  Your physician recommends that you continue on your current medications as directed. Please refer to the Current Medication list given to you today.  If you need a refill on your cardiac medications before your next appointment, please call your pharmacy.   Lab work: TODAY: CBC, CMET  If you have labs (blood work) drawn today and your tests are completely normal, you will receive your results only by: Marland Kitchen MyChart Message (if you have MyChart) OR . A paper copy in the mail If you have any lab test that is abnormal or we need to change your treatment, we will call you to review the results.  Testing/Procedures: None ordered  Follow-Up: At [facility], you and your health needs are our priority.  As part of our continuing mission to provide you with exceptional heart care, we have created designated Provider Care Teams.  These Care Teams include your primary Cardiologist (physician) and Advanced Practice Providers (APPs -  Physician Assistants and Nurse Practitioners) who all work together to provide you with the care you need, when you need it. . You will need a follow up appointment in 1 year.  Please call our office 2 months in advance to schedule this appointment.  You may see Armanda Magic, MD or one of the following Advanced Practice Providers on your designated Care Team:   . Robbie Lis, PA-C . Dayna Dunn, PA-C . Jacolyn Reedy, PA-C  Any Other Special Instructions Will Be Listed Below (If Applicable).  1. You should be getting 30 minutes a day of exercise  2. Limit the amount of sodium in your diet to 2 grams  daily  Two Gram Sodium Diet 2000 mg  What is Sodium? Sodium is a mineral found naturally in many foods. The most significant source of sodium in the diet is table salt, which is about 40% sodium.  Processed, convenience, and preserved foods also contain a large amount of sodium.  The body needs only 500 mg of sodium daily to function,  A normal diet provides more than enough sodium even if you do not use salt.  Why Limit Sodium? A build up of sodium in the body can cause thirst, increased blood pressure, shortness of breath, and water retention.  Decreasing sodium in the diet can reduce edema and risk of heart attack or stroke associated with high blood pressure.  Keep in mind that there are many other factors involved in these health problems.  Heredity, obesity, lack of exercise, cigarette smoking, stress and what you eat all play a role.  General Guidelines:  Do not add salt at the table or in cooking.  One teaspoon of salt contains over 2 grams of sodium.  Read food labels  Avoid processed and convenience foods  Ask your dietitian before eating any foods not dicussed in the menu planning guidelines  Consult your physician if you wish to use a salt substitute or a sodium containing medication such as antacids.  Limit milk and milk products to 16 oz (2 cups) per day.  Shopping Hints:  READ LABELS!! "Dietetic" does not necessarily mean low sodium.  Salt and other sodium ingredients are often added to foods during processing.   Menu Planning Guidelines Food Group Choose More Often Avoid  Beverages (see also the milk group All fruit juices, low-sodium, salt-free vegetables juices, low-sodium carbonated beverages Regular vegetable or tomato juices, commercially softened water used for drinking or cooking  Breads and Cereals Enriched white, wheat, rye and pumpernickel bread, hard rolls and dinner rolls; muffins, cornbread and waffles; most dry cereals, cooked cereal without added salt;  unsalted crackers and breadsticks; low sodium or homemade bread crumbs  Bread, rolls and crackers with salted tops; quick breads; instant hot cereals; pancakes; commercial bread stuffing; self-rising flower and biscuit mixes; regular bread crumbs or cracker crumbs  Desserts and Sweets Desserts and sweets mad with mild should be within allowance Instant pudding mixes and cake mixes  Fats Butter or margarine; vegetable oils; unsalted salad dressings, regular salad dressings limited to 1 Tbs; light, sour and heavy cream Regular salad dressings containing bacon fat, bacon bits, and salt pork; snack dips made with instant soup mixes or processed cheese; salted nuts  Fruits Most fresh, frozen and canned fruits Fruits processed with salt or sodium-containing ingredient (some dried fruits are processed with sodium sulfites        Vegetables Fresh, frozen vegetables and low- sodium canned vegetables Regular canned vegetables, sauerkraut, pickled vegetables, and others prepared in brine; frozen vegetables in sauces; vegetables seasoned with ham, bacon or salt pork  Condiments, Sauces, Miscellaneous  Salt substitute with physician's approval; pepper, herbs, spices; vinegar, lemon or lime juice; hot pepper sauce; garlic powder, onion powder, low sodium soy sauce (1 Tbs.); low sodium condiments (ketchup, chili sauce, mustard) in limited amounts (1 tsp.) fresh ground horseradish; unsalted tortilla chips, pretzels, potato chips, popcorn, salsa (1/4 cup) Any seasoning made with salt including garlic salt, celery salt, onion salt, and seasoned salt; sea salt, rock salt, kosher salt; meat tenderizers; monosodium glutamate; mustard, regular soy sauce, barbecue, sauce, chili sauce, teriyaki sauce, steak sauce, Worcestershire sauce, and most flavored vinegars; canned gravy and mixes; regular condiments; salted snack foods, olives, picles, relish, horseradish sauce, catsup   Food preparation: Try these seasonings Meats:     Pork Sage, onion Serve with applesauce  Chicken Poultry seasoning, thyme, parsley Serve with cranberry sauce  Lamb Curry powder, rosemary, garlic, thyme Serve with mint sauce or jelly  Veal Marjoram, basil Serve with current jelly, cranberry sauce  Beef Pepper, bay leaf Serve with dry mustard, unsalted chive butter  Fish [place] leaf, dill Serve with unsalted lemon butter, unsalted parsley butter  Vegetables:    Asparagus Lemon juice   Broccoli Lemon juice   Carrots Mustard dressing parsley, mint, nutmeg, glazed with unsalted butter and sugar   Green beans Marjoram, lemon juice, nutmeg,dill seed   Tomatoes Basil, marjoram, onion   Spice /blend for [employer]" 4 tsp ground thyme 1 tsp ground sage 3 tsp ground rosemary 4 tsp ground marjoram   Test your knowledge 1. A product that says "Salt Free" may still contain sodium. True or False 2. Garlic Powder and Hot Pepper Sauce an be used as alternative seasonings.True or False 3. Processed foods have more sodium than fresh foods.  True or False 4. Canned Vegetables have less sodium than froze True or False  WAYS TO DECREASE YOUR SODIUM INTAKE 1. Avoid the use of added salt in cooking and at the table.  Table salt (and other prepared seasonings which contain salt) is probably one of the greatest sources of sodium in the diet.  Unsalted foods can gain flavor from the sweet, sour, and butter taste sensations of herbs and spices.  Instead of using salt for seasoning, try the following seasonings with the foods listed.  Remember: how you use them to enhance natural food flavors is limited only by your creativity... Allspice-Meat, fish, eggs, fruit, peas, red and yellow vegetables Almond Extract-Fruit baked goods Anise Seed-Sweet breads, fruit, carrots, beets, cottage cheese, cookies (tastes like licorice) Basil-Meat, fish, eggs, vegetables, rice, vegetables salads, soups, sauces Bay Leaf-Meat, fish, stews, poultry Burnet-Salad, vegetables  (  cucumber-like flavor) Caraway Seed-Bread, cookies, cottage cheese, meat, vegetables, cheese, rice Cardamon-Baked goods, fruit, soups Celery Powder or seed-Salads, salad dressings, sauces, meatloaf, soup, bread.Do not use  celery salt Chervil-Meats, salads, fish, eggs, vegetables, cottage cheese (parsley-like flavor) Chili Power-Meatloaf, chicken cheese, corn, eggplant, egg dishes Chives-Salads cottage cheese, egg dishes, soups, vegetables, sauces Cilantro-Salsa, casseroles Cinnamon-Baked goods, fruit, pork, lamb, chicken, carrots Cloves-Fruit, baked goods, fish, pot roast, green beans, beets, carrots Coriander-Pastry, cookies, meat, salads, cheese (lemon-orange flavor) Cumin-Meatloaf, fish,cheese, eggs, cabbage,fruit pie (caraway flavor) [employer], fruit, eggs, fish, poultry, cottage cheese, vegetables Dill Seed-Meat, cottage cheese, poultry, vegetables, fish, salads, bread Fennel Seed-Bread, cookies, apples, pork, eggs, fish, beets, cabbage, cheese, Licorice-like flavor Garlic-(buds or powder) Salads, meat, poultry, fish, bread, butter, vegetables, potatoes.Do not  use garlic salt Ginger-Fruit, vegetables, baked goods, meat, fish, poultry Horseradish Root-Meet, vegetables, butter Lemon Juice or Extract-Vegetables, fruit, tea, baked goods, fish salads Mace-Baked goods fruit, vegetables, fish, poultry (taste like nutmeg) Maple Extract-Syrups Marjoram-Meat, chicken, fish, vegetables, breads, green salads (taste like Sage) Mint-Tea, lamb, sherbet, vegetables, desserts, carrots, cabbage Mustard, Dry or Seed-Cheese, eggs, meats, vegetables, poultry Nutmeg-Baked goods, fruit, chicken, eggs, vegetables, desserts Onion Powder-Meat, fish, poultry, vegetables, cheese, eggs, bread, rice salads (Do not use   Onion salt) Orange Extract-Desserts, baked goods Oregano-Pasta, eggs, cheese, onions, pork, lamb, fish, chicken, vegetables, green salads Paprika-Meat, fish, poultry, eggs, cheese,  vegetables Parsley Flakes-Butter, vegetables, meat fish, poultry, eggs, bread, salads (certain forms may   Contain sodium Pepper-Meat fish, poultry, vegetables, eggs Peppermint Extract-Desserts, baked goods Poppy Seed-Eggs, bread, cheese, fruit dressings, baked goods, noodles, vegetables, cottage  [employer], poultry, meat, fish, cauliflower, turnips,eggs bread Saffron-Rice, bread, veal, chicken, fish, eggs Sage-Meat, fish, poultry, onions, eggplant, tomateos, pork, stews Savory-Eggs, salads, poultry, meat, rice, vegetables, soups, pork Tarragon-Meat, poultry, fish, eggs, butter, vegetables (licorice-like flavor)  Thyme-Meat, poultry, fish, eggs, vegetables, (clover-like flavor), sauces, soups Tumeric-Salads, butter, eggs, fish, rice, vegetables (saffron-like flavor) Vanilla Extract-Baked goods, candy Vinegar-Salads, vegetables, meat marinades Walnut Extract-baked goods, candy  2. Choose your Foods Wisely   The following is a list of foods to avoid which are high in sodium:  Meats-Avoid all smoked, canned, salt cured, dried and kosher meat and fish as well as Anchovies   [employer] meats:Bologna, Liverwurst, Pastrami Canned meat or fish  Marinated herring Caviar    Pepperoni Corned Beef   Pizza Dried chipped beef  Salami Frozen breaded fish or meat Salt pork Frankfurters or hot dogs  Sardines Gefilte fish   Sausage Ham (boiled ham, Proscuitto Smoked butt    spiced ham)   Spam      TV Dinners Vegetables Canned vegetables (Regular) Relish Canned mushrooms  Sauerkraut Olives    Tomato juice Pickles  Bakery and Dessert Products Canned puddings  Cream pies Cheesecake   Decorated cakes Cookies  Beverages/Juices Tomato juice, regular  Gatorade   V-8 vegetable juice, regular  Breads and Cereals Biscuit mixes   Salted potato chips, corn chips, pretzels Bread stuffing mixes  Salted crackers and rolls Pancake and waffle  mixes Self-rising flour  Seasonings Accent    Meat sauces Barbecue sauce  Meat tenderizer Catsup    Monosodium glutamate (MSG) Celery salt   Onion salt Chili sauce   Prepared mustard Garlic salt   Salt, seasoned salt, sea salt Gravy mixes   Soy sauce Horseradish   Steak sauce Ketchup   Tartar sauce Lite salt    Teriyaki sauce Marinade mixes   Worcestershire sauce  Others Baking powder   Cocoa and cocoa mixes Baking soda   Commercial casserole mixes Candy-caramels, chocolate  Dehydrated soups    Bars, fudge,nougats  Instant rice and pasta mixes Canned broth or soup  Maraschino cherries Cheese, aged and processed cheese and cheese spreads  Learning Assessment Quiz  Indicated T (for True) or F (for False) for each of the following statements:  1. _____ Fresh fruits and vegetables and unprocessed grains are generally low in sodium 2. _____ Water may contain a considerable amount of sodium, depending on the source 3. _____ You can always tell if a food is high in sodium by tasting it 4. _____ Certain laxatives my be high in sodium and should be avoided unless prescribed   by a physician or pharmacist 5. _____ Salt substitutes may be used freely by anyone on a sodium restricted diet 6. _____ Sodium is present in table salt, food additives and as a natural component of   most foods 7. _____ Table salt is approximately 90% sodium 8. _____ Limiting sodium intake may help prevent excess fluid accumulation in the body 9. _____ On a sodium-restricted diet, seasonings such as bouillon soy sauce, and    cooking wine should be used in place of table salt 10. _____ On an ingredient list, a product which lists monosodium glutamate as the first   ingredient is an appropriate food to include on a low sodium diet  Circle the best answer(s) to the following statements (Hint: there may be more than one correct answer)  11. On a low-sodium diet, some acceptable snack items are:    A. Olives  F.  Bean dip   K. Grapefruit juice    B. Salted Pretzels G. Commercial Popcorn   L. Canned peaches    C. Carrot Sticks  H. Bouillon   M. Unsalted nuts   D. [place] fries  I. Peanut butter crackers N. Salami   E. Sweet pickles J. Tomato Juice   O. Pizza  12.  Seasonings that may be used freely on a reduced - sodium diet include   A. Lemon wedges F.Monosodium glutamate K. Celery seed    B.Soysauce   G. Pepper   L. Mustard powder   C. Sea salt  H. Cooking wine  M. Onion flakes   D. Vinegar  E. Prepared horseradish N. Salsa   E. Sage   J. Worcestershire sauce  O. Chutney      Signed, Jacolyn Reedy, PA-C  03/20/2018 10:16 AM    [facility] [address] [place], [place], [place]  [address] Phone: [phone]; Fax: [phone]

## 2018-03-20 ENCOUNTER — Encounter: Payer: Self-pay | Admitting: Physician Assistant

## 2018-03-20 ENCOUNTER — Ambulatory Visit: Payer: Medicare Other | Admitting: Physician Assistant

## 2018-03-20 VITALS — BP 152/88 | HR 82 | Ht 73.0 in | Wt 288.2 lb

## 2018-03-20 DIAGNOSIS — I493 Ventricular premature depolarization: Secondary | ICD-10-CM | POA: Insufficient documentation

## 2018-03-20 DIAGNOSIS — I10 Essential (primary) hypertension: Secondary | ICD-10-CM | POA: Diagnosis not present

## 2018-03-20 DIAGNOSIS — E669 Obesity, unspecified: Secondary | ICD-10-CM | POA: Diagnosis not present

## 2018-03-20 DIAGNOSIS — E782 Mixed hyperlipidemia: Secondary | ICD-10-CM

## 2018-03-20 LAB — COMPREHENSIVE METABOLIC PANEL
ALK PHOS: 72 IU/L (ref 39–117)
ALT: 38 IU/L (ref 0–44)
AST: 28 IU/L (ref 0–40)
Albumin/Globulin Ratio: 2 (ref 1.2–2.2)
Albumin: 4.5 g/dL (ref 3.6–4.8)
BUN/Creatinine Ratio: 21 (ref 10–24)
BUN: 22 mg/dL (ref 8–27)
Bilirubin Total: 0.7 mg/dL (ref 0.0–1.2)
CALCIUM: 9.7 mg/dL (ref 8.6–10.2)
CO2: 22 mmol/L (ref 20–29)
CREATININE: 1.03 mg/dL (ref 0.76–1.27)
Chloride: 99 mmol/L (ref 96–106)
GFR calc Af Amer: 85 mL/min/{1.73_m2} (ref >59)
GFR, EST NON AFRICAN AMERICAN: 74 mL/min/{1.73_m2} (ref >59)
Globulin, Total: 2.3 g/dL (ref 1.5–4.5)
Glucose: 99 mg/dL (ref 65–99)
Potassium: 4.6 mmol/L (ref 3.5–5.2)
Sodium: 138 mmol/L (ref 134–144)
Total Protein: 6.8 g/dL (ref 6.0–8.5)

## 2018-03-20 LAB — CBC
HEMATOCRIT: 43.6 % (ref 37.5–51.0)
Hemoglobin: 15.2 g/dL (ref 13.0–17.7)
MCH: 29.1 pg (ref 26.6–33.0)
MCHC: 34.9 g/dL (ref 31.5–35.7)
MCV: 84 fL (ref 79–97)
PLATELETS: 158 10*3/uL (ref 150–450)
RBC: 5.22 x10E6/uL (ref 4.14–5.80)
RDW: 13.6 % (ref 12.3–15.4)
WBC: 4.7 10*3/uL (ref 3.4–10.8)

## 2018-03-20 NOTE — Patient Instructions (Signed)
Medication Instructions:  Your physician recommends that you continue on your current medications as directed. Please refer to the Current Medication list given to you today.  If you need a refill on your cardiac medications before your next appointment, please call your pharmacy.   Lab work: TODAY: CBC, CMET  If you have labs (blood work) drawn today and your tests are completely normal, you will receive your results only by: Marland Kitchen MyChart Message (if you have MyChart) OR . A paper copy in the mail If you have any lab test that is abnormal or we need to change your treatment, we will call you to review the results.  Testing/Procedures: None ordered  Follow-Up: At [facility], [place], you and your health needs are our priority.  As part of our continuing mission to provide you with exceptional heart care, we have created designated Provider Care Teams.  These Care Teams include your primary Cardiologist (physician) and Advanced Practice Providers (APPs -  Physician Assistants and Nurse Practitioners) who all work together to provide you with the care you need, when you need it. . You will need a follow up appointment in 1 year.  Please call our office 2 months in advance to schedule this appointment.  You may see Armanda Magic, MD or one of the following Advanced Practice Providers on your designated Care Team:   . Robbie Lis, PA-C . Dayna Dunn, PA-C . Jacolyn Reedy, PA-C  Any Other Special Instructions Will Be Listed Below (If Applicable).  1. You should be getting 30 minutes a day of exercise  2. Limit the amount of sodium in your diet to 2 grams daily  Two Gram Sodium Diet 2000 mg  What is Sodium? Sodium is a mineral found naturally in many foods. The most significant source of sodium in the diet is table salt, which is about 40% sodium.  Processed, convenience, and preserved foods also contain a large amount of sodium.  The body needs only 500 mg of sodium daily to function,  A normal  diet provides more than enough sodium even if you do not use salt.  Why Limit Sodium? A build up of sodium in the body can cause thirst, increased blood pressure, shortness of breath, and water retention.  Decreasing sodium in the diet can reduce edema and risk of heart attack or stroke associated with high blood pressure.  Keep in mind that there are many other factors involved in these health problems.  Heredity, obesity, lack of exercise, cigarette smoking, stress and what you eat all play a role.  General Guidelines:  Do not add salt at the table or in cooking.  One teaspoon of salt contains over 2 grams of sodium.  Read food labels  Avoid processed and convenience foods  Ask your dietitian before eating any foods not dicussed in the menu planning guidelines  Consult your physician if you wish to use a salt substitute or a sodium containing medication such as antacids.  Limit milk and milk products to 16 oz (2 cups) per day.  Shopping Hints:  READ LABELS!! "Dietetic" does not necessarily mean low sodium.  Salt and other sodium ingredients are often added to foods during processing.   Menu Planning Guidelines Food Group Choose More Often Avoid  Beverages (see also the milk group All fruit juices, low-sodium, salt-free vegetables juices, low-sodium carbonated beverages Regular vegetable or tomato juices, commercially softened water used for drinking or cooking  Breads and Cereals Enriched white, wheat, rye and pumpernickel bread, hard  rolls and dinner rolls; muffins, cornbread and waffles; most dry cereals, cooked cereal without added salt; unsalted crackers and breadsticks; low sodium or homemade bread crumbs Bread, rolls and crackers with salted tops; quick breads; instant hot cereals; pancakes; commercial bread stuffing; self-rising flower and biscuit mixes; regular bread crumbs or cracker crumbs  Desserts and Sweets Desserts and sweets mad with mild should be within allowance Instant  pudding mixes and cake mixes  Fats Butter or margarine; vegetable oils; unsalted salad dressings, regular salad dressings limited to 1 Tbs; light, sour and heavy cream Regular salad dressings containing bacon fat, bacon bits, and salt pork; snack dips made with instant soup mixes or processed cheese; salted nuts  Fruits Most fresh, frozen and canned fruits Fruits processed with salt or sodium-containing ingredient (some dried fruits are processed with sodium sulfites        Vegetables Fresh, frozen vegetables and low- sodium canned vegetables Regular canned vegetables, sauerkraut, pickled vegetables, and others prepared in brine; frozen vegetables in sauces; vegetables seasoned with ham, bacon or salt pork  Condiments, Sauces, Miscellaneous  Salt substitute with physician's approval; pepper, herbs, spices; vinegar, lemon or lime juice; hot pepper sauce; garlic powder, onion powder, low sodium soy sauce (1 Tbs.); low sodium condiments (ketchup, chili sauce, mustard) in limited amounts (1 tsp.) fresh ground horseradish; unsalted tortilla chips, pretzels, potato chips, popcorn, salsa (1/4 cup) Any seasoning made with salt including garlic salt, celery salt, onion salt, and seasoned salt; sea salt, rock salt, kosher salt; meat tenderizers; monosodium glutamate; mustard, regular soy sauce, barbecue, sauce, chili sauce, teriyaki sauce, steak sauce, Worcestershire sauce, and most flavored vinegars; canned gravy and mixes; regular condiments; salted snack foods, olives, picles, relish, horseradish sauce, catsup   Food preparation: Try these seasonings Meats:    Pork Sage, onion Serve with applesauce  Chicken Poultry seasoning, thyme, parsley Serve with cranberry sauce  Lamb Curry powder, rosemary, garlic, thyme Serve with mint sauce or jelly  Veal Marjoram, basil Serve with current jelly, cranberry sauce  Beef Pepper, bay leaf Serve with dry mustard, unsalted chive butter  Fish [place] leaf, dill Serve with  unsalted lemon butter, unsalted parsley butter  Vegetables:    Asparagus Lemon juice   Broccoli Lemon juice   Carrots Mustard dressing parsley, mint, nutmeg, glazed with unsalted butter and sugar   Green beans Marjoram, lemon juice, nutmeg,dill seed   Tomatoes Basil, marjoram, onion   Spice /blend for [employer]" 4 tsp ground thyme 1 tsp ground sage 3 tsp ground rosemary 4 tsp ground marjoram   Test your knowledge 1. A product that says "Salt Free" may still contain sodium. True or False 2. Garlic Powder and Hot Pepper Sauce an be used as alternative seasonings.True or False 3. Processed foods have more sodium than fresh foods.  True or False 4. Canned Vegetables have less sodium than froze True or False  WAYS TO DECREASE YOUR SODIUM INTAKE 1. Avoid the use of added salt in cooking and at the table.  Table salt (and other prepared seasonings which contain salt) is probably one of the greatest sources of sodium in the diet.  Unsalted foods can gain flavor from the sweet, sour, and butter taste sensations of herbs and spices.  Instead of using salt for seasoning, try the following seasonings with the foods listed.  Remember: how you use them to enhance natural food flavors is limited only by your creativity... Allspice-Meat, fish, eggs, fruit, peas, red and yellow vegetables Almond Extract-Fruit baked goods Anise  Seed-Sweet breads, fruit, carrots, beets, cottage cheese, cookies (tastes like licorice) Basil-Meat, fish, eggs, vegetables, rice, vegetables salads, soups, sauces Bay Leaf-Meat, fish, stews, poultry Burnet-Salad, vegetables (cucumber-like flavor) Caraway Seed-Bread, cookies, cottage cheese, meat, vegetables, cheese, rice Cardamon-Baked goods, fruit, soups Celery Powder or seed-Salads, salad dressings, sauces, meatloaf, soup, bread.Do not use  celery salt Chervil-Meats, salads, fish, eggs, vegetables, cottage cheese (parsley-like flavor) Chili Power-Meatloaf, chicken cheese,  corn, eggplant, egg dishes Chives-Salads cottage cheese, egg dishes, soups, vegetables, sauces Cilantro-Salsa, casseroles Cinnamon-Baked goods, fruit, pork, lamb, chicken, carrots Cloves-Fruit, baked goods, fish, pot roast, green beans, beets, carrots Coriander-Pastry, cookies, meat, salads, cheese (lemon-orange flavor) Cumin-Meatloaf, fish,cheese, eggs, cabbage,fruit pie (caraway flavor) [employer], fruit, eggs, fish, poultry, cottage cheese, vegetables Dill Seed-Meat, cottage cheese, poultry, vegetables, fish, salads, bread Fennel Seed-Bread, cookies, apples, pork, eggs, fish, beets, cabbage, cheese, Licorice-like flavor Garlic-(buds or powder) Salads, meat, poultry, fish, bread, butter, vegetables, potatoes.Do not  use garlic salt Ginger-Fruit, vegetables, baked goods, meat, fish, poultry Horseradish Root-Meet, vegetables, butter Lemon Juice or Extract-Vegetables, fruit, tea, baked goods, fish salads Mace-Baked goods fruit, vegetables, fish, poultry (taste like nutmeg) Maple Extract-Syrups Marjoram-Meat, chicken, fish, vegetables, breads, green salads (taste like Sage) Mint-Tea, lamb, sherbet, vegetables, desserts, carrots, cabbage Mustard, Dry or Seed-Cheese, eggs, meats, vegetables, poultry Nutmeg-Baked goods, fruit, chicken, eggs, vegetables, desserts Onion Powder-Meat, fish, poultry, vegetables, cheese, eggs, bread, rice salads (Do not use   Onion salt) Orange Extract-Desserts, baked goods Oregano-Pasta, eggs, cheese, onions, pork, lamb, fish, chicken, vegetables, green salads Paprika-Meat, fish, poultry, eggs, cheese, vegetables Parsley Flakes-Butter, vegetables, meat fish, poultry, eggs, bread, salads (certain forms may   Contain sodium Pepper-Meat fish, poultry, vegetables, eggs Peppermint Extract-Desserts, baked goods Poppy Seed-Eggs, bread, cheese, fruit dressings, baked goods, noodles, vegetables, cottage  [employer],  poultry, meat, fish, cauliflower, turnips,eggs bread Saffron-Rice, bread, veal, chicken, fish, eggs Sage-Meat, fish, poultry, onions, eggplant, tomateos, pork, stews Savory-Eggs, salads, poultry, meat, rice, vegetables, soups, pork Tarragon-Meat, poultry, fish, eggs, butter, vegetables (licorice-like flavor)  Thyme-Meat, poultry, fish, eggs, vegetables, (clover-like flavor), sauces, soups Tumeric-Salads, butter, eggs, fish, rice, vegetables (saffron-like flavor) Vanilla Extract-Baked goods, candy Vinegar-Salads, vegetables, meat marinades Walnut Extract-baked goods, candy  2. Choose your Foods Wisely   The following is a list of foods to avoid which are high in sodium:  Meats-Avoid all smoked, canned, salt cured, dried and kosher meat and fish as well as Anchovies   [employer] meats:Bologna, Liverwurst, Pastrami Canned meat or fish  Marinated herring Caviar    Pepperoni Corned Beef   Pizza Dried chipped beef  Salami Frozen breaded fish or meat Salt pork Frankfurters or hot dogs  Sardines Gefilte fish   Sausage Ham (boiled ham, Proscuitto Smoked butt    spiced ham)   Spam      TV Dinners Vegetables Canned vegetables (Regular) Relish Canned mushrooms  Sauerkraut Olives    Tomato juice Pickles  Bakery and Dessert Products Canned puddings  Cream pies Cheesecake   Decorated cakes Cookies  Beverages/Juices Tomato juice, regular  Gatorade   V-8 vegetable juice, regular  Breads and Cereals Biscuit mixes   Salted potato chips, corn chips, pretzels Bread stuffing mixes  Salted crackers and rolls Pancake and waffle mixes Self-rising flour  Seasonings Accent    Meat sauces Barbecue sauce  Meat tenderizer Catsup    Monosodium glutamate (MSG) Celery salt   Onion salt Chili sauce   Prepared mustard Garlic salt   Salt, seasoned salt, sea salt Gravy mixes  Soy sauce Horseradish   Steak sauce Ketchup   Tartar sauce Lite salt    Teriyaki sauce Marinade  mixes   Worcestershire sauce  Others Baking powder   Cocoa and cocoa mixes Baking soda   Commercial casserole mixes Candy-caramels, chocolate  Dehydrated soups    Bars, fudge,nougats  Instant rice and pasta mixes Canned broth or soup  Maraschino cherries Cheese, aged and processed cheese and cheese spreads  Learning Assessment Quiz  Indicated T (for True) or F (for False) for each of the following statements:  1. _____ Fresh fruits and vegetables and unprocessed grains are generally low in sodium 2. _____ Water may contain a considerable amount of sodium, depending on the source 3. _____ You can always tell if a food is high in sodium by tasting it 4. _____ Certain laxatives my be high in sodium and should be avoided unless prescribed   by a physician or pharmacist 5. _____ Salt substitutes may be used freely by anyone on a sodium restricted diet 6. _____ Sodium is present in table salt, food additives and as a natural component of   most foods 7. _____ Table salt is approximately 90% sodium 8. _____ Limiting sodium intake may help prevent excess fluid accumulation in the body 9. _____ On a sodium-restricted diet, seasonings such as bouillon soy sauce, and    cooking wine should be used in place of table salt 10. _____ On an ingredient list, a product which lists monosodium glutamate as the first   ingredient is an appropriate food to include on a low sodium diet  Circle the best answer(s) to the following statements (Hint: there may be more than one correct answer)  11. On a low-sodium diet, some acceptable snack items are:    A. Olives  F. Bean dip   K. Grapefruit juice    B. Salted Pretzels G. Commercial Popcorn   L. Canned peaches    C. Carrot Sticks  H. Bouillon   M. Unsalted nuts   D. [place]French fries  I. Peanut butter crackers N. Salami   E. Sweet pickles J. Tomato Juice   O. Pizza  12.  Seasonings that may be used freely on a reduced - sodium diet include   A. Lemon  wedges F.Monosodium glutamate K. Celery seed    B.Soysauce   G. Pepper   L. Mustard powder   C. Sea salt  H. Cooking wine  M. Onion flakes   D. Vinegar  E. Prepared horseradish N. Salsa   E. Sage   J. Worcestershire sauce  O. Chutney

## 2018-04-17 ENCOUNTER — Other Ambulatory Visit: Payer: Self-pay | Admitting: Cardiology

## 2018-05-29 ENCOUNTER — Other Ambulatory Visit: Payer: Self-pay | Admitting: Urology

## 2018-05-29 DIAGNOSIS — R972 Elevated prostate specific antigen [PSA]: Secondary | ICD-10-CM

## 2018-06-18 DIAGNOSIS — Z0001 Encounter for general adult medical examination with abnormal findings: Secondary | ICD-10-CM | POA: Diagnosis not present

## 2018-06-18 DIAGNOSIS — I10 Essential (primary) hypertension: Secondary | ICD-10-CM | POA: Diagnosis not present

## 2018-06-18 DIAGNOSIS — Z1211 Encounter for screening for malignant neoplasm of colon: Secondary | ICD-10-CM | POA: Diagnosis not present

## 2018-06-18 DIAGNOSIS — E7800 Pure hypercholesterolemia, unspecified: Secondary | ICD-10-CM | POA: Diagnosis not present

## 2018-07-14 ENCOUNTER — Other Ambulatory Visit: Payer: Self-pay | Admitting: Cardiology

## 2018-11-29 DIAGNOSIS — L570 Actinic keratosis: Secondary | ICD-10-CM | POA: Diagnosis not present

## 2018-11-29 DIAGNOSIS — D225 Melanocytic nevi of trunk: Secondary | ICD-10-CM | POA: Diagnosis not present

## 2018-11-29 DIAGNOSIS — L821 Other seborrheic keratosis: Secondary | ICD-10-CM | POA: Diagnosis not present

## 2018-11-29 DIAGNOSIS — L720 Epidermal cyst: Secondary | ICD-10-CM | POA: Diagnosis not present

## 2018-11-29 DIAGNOSIS — L722 Steatocystoma multiplex: Secondary | ICD-10-CM | POA: Diagnosis not present

## 2019-05-06 ENCOUNTER — Other Ambulatory Visit: Payer: Self-pay | Admitting: Cardiology

## 2019-05-12 NOTE — Progress Notes (Signed)
Cardiology Office Note:    Date:  05/13/2019   ID:  Anthony Malone, DOB 1950/09/29, MRN [id number]  PCP:  Lujean Amel, MD  Cardiologist:  Fransico Him, MD    Referring MD: Lujean Amel, MD   Chief Complaint  Patient presents with  . Hypertension  . Hyperlipidemia    History of Present Illness:    Anthony Malone is a 69 y.o. male with a hx of HTN and chest pain in the past with normal cardiac workup who presents today for followup of HTN. He had a stress test a while back for some exertional fatigue which was normal.  He is here today for followup and is doing well.  He denies any chest pain or pressure, SOB, DOE, PND, orthopnea, LE edema, dizziness, palpitations or syncope.  He has gained some weight due to increased sedentary state with COVID.  He is compliant with his meds and is tolerating meds with no SE.    Past Medical History:  Diagnosis Date  . Chest pain    Dr Radford Pax- normal echo-no ischemia on nuclear stress 6/12  . ED (erectile dysfunction)   . Gout   . High cholesterol   . History of EKG    LAFB  . Hypertension     No past surgical history on file.  Current Medications: Current Meds  Medication Sig  . allopurinol (ZYLOPRIM) 300 MG tablet Take 150 mg by mouth daily.   Marland Kitchen amLODipine (NORVASC) 5 MG tablet Take 1 tablet by mouth twice daily  . aspirin 81 MG tablet Take 81 mg by mouth daily.  . hydrochlorothiazide (HYDRODIURIL) 25 MG tablet Take 25 mg by mouth daily.  Marland Kitchen losartan (COZAAR) 100 MG tablet Take 1 tablet by mouth once daily  . naproxen (NAPROSYN) 500 MG tablet Take 500 mg by mouth as needed. As  Needed for joint pain with food     Allergies:   Ibuprofen   Social History   Socioeconomic History  . Marital status: Married    Spouse name: Not on file  . Number of children: Not on file  . Years of education: Not on file  . Highest education level: Not on file  Occupational History  . Not on file  Tobacco Use  . Smoking status: Never  Smoker  . Smokeless tobacco: Never Used  Substance and Sexual Activity  . Alcohol use: Yes    Comment: rare  . Drug use: No  . Sexual activity: Not on file  Other Topics Concern  . Not on file  Social History Narrative  . Not on file   Social Determinants of Health   Financial Resource Strain:   . Difficulty of Paying Living Expenses: Not on file  Food Insecurity:   . Worried About Charity fundraiser in the Last Year: Not on file  . Ran Out of Food in the Last Year: Not on file  Transportation Needs:   . Lack of Transportation (Medical): Not on file  . Lack of Transportation (Non-Medical): Not on file  Physical Activity:   . Days of Exercise per Week: Not on file  . Minutes of Exercise per Session: Not on file  Stress:   . Feeling of Stress : Not on file  Social Connections:   . Frequency of Communication with Friends and Family: Not on file  . Frequency of Social Gatherings with Friends and Family: Not on file  . Attends Religious Services: Not on file  . Active Member of  Clubs or Organizations: Not on file  . Attends Banker Meetings: Not on file  . Marital Status: Not on file     Family History: The patient's family history includes Breast cancer in his mother; CVA in his father; Hypertension in his father and mother.  ROS:   Please see the history of present illness.    ROS  All other systems reviewed and negative.   EKGs/Labs/Other Studies Reviewed:    The following studies were reviewed today: none  EKG:  EKG is  ordered today.  The ekg ordered today demonstrates NSR with LAFB  Recent Labs: No results found for requested labs within last 8760 hours.   Recent Lipid Panel No results found for: CHOL, TRIG, HDL, CHOLHDL, VLDL, LDLCALC, LDLDIRECT  Physical Exam:    VS:  BP (!) 172/96   Pulse 93   Ht 6\' 1"  (1.854 m)   Wt 295 lb (133.8 kg)   BMI 38.92 kg/m     Wt Readings from Last 3 Encounters:  05/13/19 295 lb (133.8 kg)  03/20/18 288  lb 3.2 oz (130.7 kg)  03/15/17 295 lb 3.2 oz (133.9 kg)     GEN:  Well nourished, well developed in no acute distress HEENT: Normal NECK: No JVD; No carotid bruits LYMPHATICS: No lymphadenopathy CARDIAC: RRR, no murmurs, rubs, gallops RESPIRATORY:  Clear to auscultation without rales, wheezing or rhonchi  ABDOMEN: Soft, non-tender, non-distended MUSCULOSKELETAL:  No edema; No deformity  SKIN: Warm and dry NEUROLOGIC:  Alert and oriented x 3 PSYCHIATRIC:  Normal affect   ASSESSMENT:    1. Essential hypertension, benign   2. Pure hypercholesterolemia    PLAN:    In order of problems listed above:  1.  HTN -BP poorly controlled -continue amlodipine 5mg  daily,  HCTZ 25mg  daily and Losartan 100mg  daily -cannot increase amlodipine due to LE edema on higher doses -start doxazosin 1mg  qhs  -check Bp daily at lunch for a week and call with results -check BMET  2.  HLD -followed by PCP   Medication Adjustments/Labs and Tests Ordered: Current medicines are reviewed at length with the patient today.  Concerns regarding medicines are outlined above.  Orders Placed This Encounter  Procedures  . EKG 12/Charge capture   No orders of the defined types were placed in this encounter.   Signed, 03/17/17, MD  05/13/2019 8:06 AM    [facility]

## 2019-05-13 ENCOUNTER — Other Ambulatory Visit: Payer: Self-pay

## 2019-05-13 ENCOUNTER — Encounter: Payer: Self-pay | Admitting: Cardiology

## 2019-05-13 ENCOUNTER — Ambulatory Visit: Payer: Medicare Other | Admitting: Cardiology

## 2019-05-13 VITALS — BP 172/96 | HR 93 | Ht 73.0 in | Wt 295.0 lb

## 2019-05-13 DIAGNOSIS — I10 Essential (primary) hypertension: Secondary | ICD-10-CM

## 2019-05-13 DIAGNOSIS — E7800 Pure hypercholesterolemia, unspecified: Secondary | ICD-10-CM

## 2019-05-13 LAB — BASIC METABOLIC PANEL
BUN/Creatinine Ratio: 24 (ref 10–24)
BUN: 23 mg/dL (ref 8–27)
CO2: 24 mmol/L (ref 20–29)
Calcium: 9.9 mg/dL (ref 8.6–10.2)
Chloride: 101 mmol/L (ref 96–106)
Creatinine, Ser: 0.94 mg/dL (ref 0.76–1.27)
GFR calc Af Amer: 95 mL/min/{1.73_m2} (ref >59)
GFR calc non Af Amer: 82 mL/min/{1.73_m2} (ref >59)
Glucose: 96 mg/dL (ref 65–99)
Potassium: 4.4 mmol/L (ref 3.5–5.2)
Sodium: 139 mmol/L (ref 134–144)

## 2019-05-13 MED ORDER — DOXAZOSIN MESYLATE 1 MG PO TABS: 1.0000 mg | ORAL_TABLET | Freq: Every day | ORAL | 3 refills | Status: DC

## 2019-05-13 NOTE — Patient Instructions (Signed)
Medication Instructions:  Your physician has recommended you make the following change in your medication:   1) START taking doxazosin (Cardura) 1 mg daily at bedtime.   *If you need a refill on your cardiac medications before your next appointment, please call your pharmacy*  Lab Work: TODAY: BMET  If you have labs (blood work) drawn today and your tests are completely normal, you will receive your results only by: Marland Kitchen MyChart Message (if you have MyChart) OR . A paper copy in the mail If you have any lab test that is abnormal or we need to change your treatment, we will call you to review the results.  Follow-Up: At [facility], you and your health needs are our priority.  As part of our continuing mission to provide you with exceptional heart care, we have created designated Provider Care Teams.  These Care Teams include your primary Cardiologist (physician) and Advanced Practice Providers (APPs -  Physician Assistants and Nurse Practitioners) who all work together to provide you with the care you need, when you need it.  Your next appointment:   1 year(s)  The format for your next appointment:   Either In Person or Virtual  Provider:   Armanda Magic, MD  Other Instructions Please take your blood pressure daily at lunch time for one week and call us with the readings.

## 2019-05-14 DIAGNOSIS — Z0120 Encounter for dental examination and cleaning without abnormal findings: Secondary | ICD-10-CM | POA: Diagnosis not present

## 2019-05-26 ENCOUNTER — Other Ambulatory Visit: Payer: Self-pay

## 2019-05-26 MED ORDER — DOXAZOSIN MESYLATE 1 MG PO TABS: 2.0000 mg | ORAL_TABLET | Freq: Every day | ORAL | 3 refills | Status: DC

## 2019-05-26 NOTE — Telephone Encounter (Signed)
Would like BP to be a little better  Increase Doxazosin to 2mg  qhs and check BP daily for a week and call with results.   Anthony Malone

## 2019-05-27 ENCOUNTER — Ambulatory Visit: Payer: Medicare Other

## 2019-06-05 ENCOUNTER — Ambulatory Visit: Payer: Medicare Other | Attending: Internal Medicine

## 2019-06-05 DIAGNOSIS — Z23 Encounter for immunization: Secondary | ICD-10-CM | POA: Insufficient documentation

## 2019-06-06 MED ORDER — DOXAZOSIN MESYLATE 2 MG PO TABS: 2.0000 mg | ORAL_TABLET | Freq: Every day | ORAL | 3 refills | Status: DC

## 2019-06-06 NOTE — Addendum Note (Signed)
Addended by: Julio Sicks on: 06/06/2019 09:04 AM   Modules accepted: Orders

## 2019-06-13 ENCOUNTER — Ambulatory Visit: Payer: Medicare Other

## 2019-07-01 ENCOUNTER — Ambulatory Visit: Payer: Medicare Other | Attending: Internal Medicine

## 2019-07-01 DIAGNOSIS — Z23 Encounter for immunization: Secondary | ICD-10-CM | POA: Insufficient documentation

## 2019-07-01 NOTE — Progress Notes (Signed)
   Covid-19 Vaccination Clinic  Name:  Anthony Malone    MRN: [id number] DOB: 30-Jul-1950  07/01/2019  Mr. Anthony Malone was observed post Covid-19 immunization for 15 minutes without incident. He was provided with Vaccine Information Sheet and instruction to access the V-Safe system.   Mr. Anthony Malone was instructed to call 911 with any severe reactions post vaccine: Marland Kitchen Difficulty breathing  . Swelling of face and throat  . A fast heartbeat  . A bad rash all over body  . Dizziness and weakness   Immunizations Administered    Name Date Dose VIS Date Route   Pfizer COVID-19 Vaccine 07/01/2019  8:19 AM 0.3 mL 04/11/2019 Intramuscular   Manufacturer: [employer], [employer]   Lot: [id number]   NDC: [address]-1

## 2019-07-14 DIAGNOSIS — M7989 Other specified soft tissue disorders: Secondary | ICD-10-CM | POA: Diagnosis not present

## 2019-07-14 DIAGNOSIS — R600 Localized edema: Secondary | ICD-10-CM | POA: Diagnosis not present

## 2019-07-14 DIAGNOSIS — R197 Diarrhea, unspecified: Secondary | ICD-10-CM | POA: Diagnosis not present

## 2019-08-12 DIAGNOSIS — Z1159 Encounter for screening for other viral diseases: Secondary | ICD-10-CM | POA: Diagnosis not present

## 2019-08-12 DIAGNOSIS — Z0001 Encounter for general adult medical examination with abnormal findings: Secondary | ICD-10-CM | POA: Diagnosis not present

## 2019-08-12 DIAGNOSIS — I10 Essential (primary) hypertension: Secondary | ICD-10-CM | POA: Diagnosis not present

## 2019-08-12 DIAGNOSIS — E7800 Pure hypercholesterolemia, unspecified: Secondary | ICD-10-CM | POA: Diagnosis not present

## 2019-08-12 DIAGNOSIS — M109 Gout, unspecified: Secondary | ICD-10-CM | POA: Diagnosis not present

## 2019-08-26 DIAGNOSIS — Z0001 Encounter for general adult medical examination with abnormal findings: Secondary | ICD-10-CM | POA: Diagnosis not present

## 2020-05-11 ENCOUNTER — Encounter: Payer: Self-pay | Admitting: Cardiology

## 2020-05-11 ENCOUNTER — Other Ambulatory Visit: Payer: Self-pay

## 2020-05-11 ENCOUNTER — Ambulatory Visit: Payer: Medicare Other | Admitting: Cardiology

## 2020-05-11 VITALS — BP 150/94 | HR 83 | Ht 72.0 in | Wt 274.0 lb

## 2020-05-11 DIAGNOSIS — I493 Ventricular premature depolarization: Secondary | ICD-10-CM | POA: Diagnosis not present

## 2020-05-11 DIAGNOSIS — E7800 Pure hypercholesterolemia, unspecified: Secondary | ICD-10-CM

## 2020-05-11 DIAGNOSIS — R072 Precordial pain: Secondary | ICD-10-CM

## 2020-05-11 DIAGNOSIS — R079 Chest pain, unspecified: Secondary | ICD-10-CM | POA: Diagnosis not present

## 2020-05-11 DIAGNOSIS — I10 Essential (primary) hypertension: Secondary | ICD-10-CM

## 2020-05-11 MED ORDER — METOPROLOL TARTRATE 100 MG PO TABS: ORAL_TABLET | ORAL | 0 refills | Status: DC

## 2020-05-11 MED ORDER — CHLORTHALIDONE 25 MG PO TABS: 25.0000 mg | ORAL_TABLET | Freq: Every day | ORAL | 3 refills | Status: DC

## 2020-05-11 NOTE — Addendum Note (Signed)
Addended by: Theresia Majors on: 05/11/2020 08:31 AM   Modules accepted: Orders

## 2020-05-11 NOTE — Patient Instructions (Signed)
Medication Instructions:  Your physician has recommended you make the following change in your medication: 1) STOP taking hydrochlorothiazide 2) START taking chlorthalidone 25 mg daily  *If you need a refill on your cardiac medications before your next appointment, please call your pharmacy*   Lab Work: BMET in one week  If you have labs (blood work) drawn today and your tests are completely normal, you will receive your results only by: Marland Kitchen MyChart Message (if you have MyChart) OR . A paper copy in the mail If you have any lab test that is abnormal or we need to change your treatment, we will call you to review the results.   Testing/Procedures: Your physician has requested that you have an echocardiogram. Echocardiography is a painless test that uses sound waves to create images of your heart. It provides your doctor with information about the size and shape of your heart and how well your heart's chambers and valves are working. This procedure takes approximately one hour. There are no restrictions for this procedure.  Your physician has requested that you have a coronary CTA scan. Please see next page for further instructions.  Follow-Up: At [facility], you and your health needs are our priority.  As part of our continuing mission to provide you with exceptional heart care, we have created designated Provider Care Teams.  These Care Teams include your primary Cardiologist (physician) and Advanced Practice Providers (APPs -  Physician Assistants and Nurse Practitioners) who all work together to provide you with the care you need, when you need it.  Your next appointment:   1 year(s)  The format for your next appointment:   In Person  Provider:   You may see Fransico Him, MD or one of the following Advanced Practice Providers on your designated Care Team:    Melina Copa, PA-C  Ermalinda Barrios, PA-C  Other Instructions Your cardiac CT will be scheduled at:   [facility] [address] [place], [place] [address] [phone]  Please arrive at the [facility] main entrance of [facility] 30 minutes prior to test start time. Proceed to the [facility] Radiology Department (first floor) to check-in and test prep.   Please follow these instructions carefully (unless otherwise directed):  Hold all erectile dysfunction medications at least 3 days (72 hrs) prior to test.  On the Night Before the Test: . Be sure to Drink plenty of water. . Do not consume any caffeinated/decaffeinated beverages or chocolate 12 hours prior to your test. . Do not take any antihistamines 12 hours prior to your test.  On the Day of the Test: . Drink plenty of water. Do not drink any water within one hour of the test. . Do not eat any food 4 hours prior to the test. . You may take your regular medications prior to the test.  . Take metoprolol (Lopressor) two hours prior to test.      After the Test: . Drink plenty of water. . After receiving IV contrast, you may experience a mild flushed feeling. This is normal. . On occasion, you may experience a mild rash up to 24 hours after the test. This is not dangerous. If this occurs, you can take Benadryl 25 mg and increase your fluid intake. . If you experience trouble breathing, this can be serious. If it is severe call 911 IMMEDIATELY. If it is mild, please call our office. . If you take any of these medications: Glipizide/Metformin, Avandament, Glucavance, please  do not take 48 hours after completing test unless otherwise instructed.   Once we have confirmed authorization from your insurance company, we will call you to set up a date and time for your test. Based on how quickly your insurance processes prior authorizations requests, please allow up to 4 weeks to be contacted for scheduling your Cardiac CT appointment. Be advised that routine Cardiac CT appointments could be scheduled as many as 8 weeks after your  provider has ordered it.  For non-scheduling related questions, please contact the cardiac imaging nurse navigator should you have any questions/concerns: Marchia Bond, Cardiac Imaging Nurse Navigator Burley Saver, Interim Cardiac Imaging Nurse [place] [facility] Direct Office Dial: [phone]   For scheduling needs, including cancellations and rescheduling, please call [place], [phone].

## 2020-05-11 NOTE — Progress Notes (Signed)
Cardiology Office Note:    Date:  05/11/2020   ID:  Anthony Malone, DOB 1950-09-14, MRN [id number]  PCP:  Darrow Bussing, MD  Cardiologist:  Armanda Magic, MD    Referring MD: Darrow Bussing, MD   Chief Complaint  Patient presents with  . Hypertension  . Hyperlipidemia    History of Present Illness:    Anthony Malone is a 70 y.o. male with a hx of HTN and chest pain in the past with normal cardiac workup who presents today for followup of HTN. He had a stress test a while back for some exertional fatigue which was normal.  He is here today for followup and is doing well.  He tells me that he has noticed occasional episodes of tingling in his chest that goes into his neck and jaw sometimes with exertion and sometimes at rest.  He sometimes gets SOB with the discomfort but no associated nausea or diaphoresis.  He also notices that he gets more fatigued when walking at car shows than he did before. He denies any PND, orthopnea, LE edema, dizziness, palpitations or syncope. He is compliant with his meds and is tolerating meds with no SE.    Past Medical History:  Diagnosis Date  . Chest pain    Dr Mayford Knife- normal echo-no ischemia on nuclear stress 6/12  . ED (erectile dysfunction)   . Gout   . High cholesterol   . History of EKG    LAFB  . Hypertension     No past surgical history on file.  Current Medications: Current Meds  Medication Sig  . allopurinol (ZYLOPRIM) 300 MG tablet Take 150 mg by mouth daily.   Marland Kitchen amLODipine (NORVASC) 5 MG tablet Take 1 tablet by mouth twice daily (Patient taking differently: Take 5 mg by mouth daily.)  . aspirin 81 MG tablet Take 81 mg by mouth daily.  Marland Kitchen doxazosin (CARDURA) 2 MG tablet Take 1 tablet (2 mg total) by mouth at bedtime.  . hydrochlorothiazide (HYDRODIURIL) 25 MG tablet Take 25 mg by mouth daily.  Marland Kitchen losartan (COZAAR) 100 MG tablet Take 1 tablet by mouth once daily  . naproxen (NAPROSYN) 500 MG tablet Take 500 mg by mouth as needed. As   Needed for joint pain with food     Allergies:   Ibuprofen   Social History   Socioeconomic History  . Marital status: Married    Spouse name: Not on file  . Number of children: Not on file  . Years of education: Not on file  . Highest education level: Not on file  Occupational History  . Not on file  Tobacco Use  . Smoking status: Never Smoker  . Smokeless tobacco: Never Used  Substance and Sexual Activity  . Alcohol use: Yes    Comment: rare  . Drug use: No  . Sexual activity: Not on file  Other Topics Concern  . Not on file  Social History Narrative  . Not on file   Social Determinants of Health   Financial Resource Strain: Not on file  Food Insecurity: Not on file  Transportation Needs: Not on file  Physical Activity: Not on file  Stress: Not on file  Social Connections: Not on file     Family History: The patient's family history includes Breast cancer in his mother; CVA in his father; Hypertension in his father and mother.  ROS:   Please see the history of present illness.    ROS  All other systems  reviewed and negative.   EKGs/Labs/Other Studies Reviewed:    The following studies were reviewed today: none  EKG:  EKG is  ordered today.  The ekg ordered today demonstrates NSR with ventricular couplet and septal infarct, LAFB and iLBBB  Recent Labs: 05/13/2019: BUN 23; Creatinine, Ser 0.94; Potassium 4.4; Sodium 139   Recent Lipid Panel No results found for: CHOL, TRIG, HDL, CHOLHDL, VLDL, LDLCALC, LDLDIRECT  Physical Exam:    VS:  BP (!) 150/94   Pulse 83   Ht 6' (1.829 m)   Wt 274 lb (124.3 kg)   SpO2 99%   BMI 37.16 kg/m     Wt Readings from Last 3 Encounters:  05/11/20 274 lb (124.3 kg)  05/13/19 295 lb (133.8 kg)  03/20/18 288 lb 3.2 oz (130.7 kg)     GEN: Well nourished, well developed in no acute distress HEENT: Normal NECK: No JVD; No carotid bruits LYMPHATICS: No lymphadenopathy CARDIAC:RRR, no murmurs, rubs,  gallops RESPIRATORY:  Clear to auscultation without rales, wheezing or rhonchi  ABDOMEN: Soft, non-tender, non-distended MUSCULOSKELETAL:  No edema; No deformity  SKIN: Warm and dry NEUROLOGIC:  Alert and oriented x 3 PSYCHIATRIC:  Normal affect    ASSESSMENT:    1. Essential hypertension, benign   2. Pure hypercholesterolemia   3. PVC's (premature ventricular contractions)   4. Chest pain of uncertain etiology    PLAN:    In order of problems listed above:  1.  HTN -BP borderline controlled on exam today but says that at home it runs 135-145/80's.   -continue amlodipine 5mg  daily, doxazosin 2mg  qhs and Losartan 100mg  daily -cannot increase amlodipine due to LE edema on higher doses -stop HCTZ and start Chlorthalidone 25mg  daily -check Bp daily at lunch for a week and call with results -check BMET in 1 week  2.  HLD -followed by PCP -LDL was 80 in April and goal is < 100  3.  PVCs -EKG shows ventricular couplets -asymptomatic  4.  Chest discomfort -he has vague episodes of tingling in his chest and jaw but is exertional and nonexertional and associated with DOE -his EKG shows an iRBBB -I will get a coronary CTA to rule out CAD -check 2D echo to assess LVF   Medication Adjustments/Labs and Tests Ordered: Current medicines are reviewed at length with the patient today.  Concerns regarding medicines are outlined above.  No orders of the defined types were placed in this encounter.  No orders of the defined types were placed in this encounter.   Signed, , MD  05/11/2020 8:20 AM    [facility]

## 2020-05-12 ENCOUNTER — Other Ambulatory Visit (INDEPENDENT_AMBULATORY_CARE_PROVIDER_SITE_OTHER): Payer: Medicare Other

## 2020-05-12 DIAGNOSIS — I493 Ventricular premature depolarization: Secondary | ICD-10-CM

## 2020-05-18 ENCOUNTER — Other Ambulatory Visit: Payer: Medicare Other

## 2020-05-22 ENCOUNTER — Other Ambulatory Visit: Payer: Self-pay | Admitting: Cardiology

## 2020-05-24 ENCOUNTER — Other Ambulatory Visit: Payer: Self-pay

## 2020-05-24 ENCOUNTER — Other Ambulatory Visit: Payer: Medicare Other | Admitting: *Deleted

## 2020-05-24 DIAGNOSIS — I10 Essential (primary) hypertension: Secondary | ICD-10-CM | POA: Diagnosis not present

## 2020-05-24 LAB — BASIC METABOLIC PANEL
BUN/Creatinine Ratio: 22 (ref 10–24)
BUN: 24 mg/dL (ref 8–27)
CO2: 24 mmol/L (ref 20–29)
Calcium: 9.3 mg/dL (ref 8.6–10.2)
Chloride: 103 mmol/L (ref 96–106)
Creatinine, Ser: 1.08 mg/dL (ref 0.76–1.27)
GFR calc Af Amer: 79 mL/min/{1.73_m2} (ref >59)
GFR calc non Af Amer: 69 mL/min/{1.73_m2} (ref >59)
Glucose: 137 mg/dL — ABNORMAL HIGH (ref 65–99)
Potassium: 4.3 mmol/L (ref 3.5–5.2)
Sodium: 141 mmol/L (ref 134–144)

## 2020-05-24 MED ORDER — CHLORTHALIDONE 50 MG PO TABS: 50.0000 mg | ORAL_TABLET | Freq: Every day | ORAL | 3 refills | Status: DC

## 2020-05-25 NOTE — Telephone Encounter (Signed)
Spoke with the patient and he was confused because he got a message saying his lab work was normal. I advised him that lab work was done to check kidney function prior to CT scan which was normal. However he sent over blood pressure readings so Dr. Mayford Knife wanted him to increase chlorthalidone to 50 mg daily and then check additional blood work to make sure that he was tolerating the increased dose. Patient verbalized understanding and states that he will increase his dose when he gets back into town in a couple days and he will repeat lab work on 2/7

## 2020-05-27 ENCOUNTER — Telehealth (HOSPITAL_COMMUNITY): Payer: Self-pay | Admitting: Emergency Medicine

## 2020-05-27 NOTE — Telephone Encounter (Signed)
Pt returning phone call regarding upcoming cardiac imaging study; pt verbalizes understanding of appt date/time, parking situation and where to check in, pre-test NPO status and medications ordered, and verified current allergies; name and call back number provided for further questions should they arise Dianey Suchy RN Navigator Cardiac Imaging [facility] [phone] office [phone] cell   

## 2020-05-27 NOTE — Telephone Encounter (Signed)
Attempted to call patient regarding upcoming cardiac CT appointment. °Left message on voicemail with name and callback number °Cherish Runde RN Navigator Cardiac Imaging °[facility] °[phone] Office °[phone] Cell ° °

## 2020-05-31 ENCOUNTER — Ambulatory Visit (HOSPITAL_COMMUNITY)
Admission: RE | Admit: 2020-05-31 | Discharge: 2020-05-31 | Disposition: A | Discharge status: Home / Self Care | Payer: Medicare Other | Source: Ambulatory Visit | Attending: Cardiology | Admitting: Cardiology

## 2020-05-31 ENCOUNTER — Encounter: Payer: Medicare Other | Admitting: *Deleted

## 2020-05-31 ENCOUNTER — Other Ambulatory Visit: Payer: Self-pay

## 2020-05-31 ENCOUNTER — Encounter (HOSPITAL_COMMUNITY): Payer: Self-pay

## 2020-05-31 DIAGNOSIS — R918 Other nonspecific abnormal finding of lung field: Secondary | ICD-10-CM | POA: Diagnosis not present

## 2020-05-31 DIAGNOSIS — I2510 Atherosclerotic heart disease of native coronary artery without angina pectoris: Secondary | ICD-10-CM | POA: Insufficient documentation

## 2020-05-31 DIAGNOSIS — R072 Precordial pain: Secondary | ICD-10-CM | POA: Diagnosis not present

## 2020-05-31 DIAGNOSIS — I700 Atherosclerosis of aorta: Secondary | ICD-10-CM | POA: Diagnosis not present

## 2020-05-31 DIAGNOSIS — Z006 Encounter for examination for normal comparison and control in clinical research program: Secondary | ICD-10-CM

## 2020-05-31 MED ORDER — IOHEXOL 350 MG/ML SOLN
100.0000 mL | Freq: Once | INTRAVENOUS | Status: AC | PRN
  Administered 2020-05-31: 100 mL via INTRAVENOUS

## 2020-05-31 MED ORDER — NITROGLYCERIN 0.4 MG SL SUBL
SUBLINGUAL_TABLET | SUBLINGUAL | Status: AC
  Filled 2020-05-31: qty 2

## 2020-05-31 MED ORDER — NITROGLYCERIN 0.4 MG SL SUBL
0.8000 mg | SUBLINGUAL_TABLET | Freq: Once | SUBLINGUAL | Status: AC
  Administered 2020-05-31: 0.8 mg via SUBLINGUAL

## 2020-05-31 NOTE — Research (Signed)
Subject Name: Anthony Malone  Subject met inclusion and exclusion criteria.  The informed consent form, study requirements and expectations were reviewed with the subject and questions and concerns were addressed prior to the signing of the consent form.  The subject verbalized understanding of the trial requirements.  The subject agreed to participate in the IDENTIFY trial and signed the informed consent at [place] on 05/31/20  The informed consent was obtained prior to performance of any protocol-specific procedures for the subject.  A copy of the signed informed consent was given to the subject and a copy was placed in the subject's medical record.   [name]

## 2020-06-01 DIAGNOSIS — R072 Precordial pain: Secondary | ICD-10-CM | POA: Diagnosis not present

## 2020-06-02 ENCOUNTER — Telehealth: Payer: Self-pay

## 2020-06-02 DIAGNOSIS — E7800 Pure hypercholesterolemia, unspecified: Secondary | ICD-10-CM

## 2020-06-02 MED ORDER — METOPROLOL SUCCINATE ER 25 MG PO TB24: 25.0000 mg | ORAL_TABLET | Freq: Every day | ORAL | 3 refills | Status: DC

## 2020-06-02 NOTE — Telephone Encounter (Signed)
The patient has been notified of the result and verbalized understanding.  All questions (if any) were answered. Theresia Majors, RN 06/02/2020 2:09 PM  Patient will come in for FLP, ALT on Monday 2/7 when he is here for BMET and echo.  He is scheduled for FU visit with Ronie Spies on 2/18.

## 2020-06-02 NOTE — Telephone Encounter (Signed)
-----   Message from Quintella Reichert, MD sent at 06/01/2020  8:25 PM EST ----- No flow limiting lesions on FFR.  Add Toprol Xl 25mg  daily .  Please have him come in for FLp and ALT and followup with PA in 2-3 weeks to see how he is doing

## 2020-06-07 ENCOUNTER — Other Ambulatory Visit: Payer: Medicare Other

## 2020-06-07 ENCOUNTER — Other Ambulatory Visit (HOSPITAL_COMMUNITY): Payer: Medicare Other

## 2020-06-11 ENCOUNTER — Other Ambulatory Visit: Payer: Self-pay

## 2020-06-11 ENCOUNTER — Other Ambulatory Visit: Payer: Medicare Other | Admitting: *Deleted

## 2020-06-11 DIAGNOSIS — I10 Essential (primary) hypertension: Secondary | ICD-10-CM | POA: Diagnosis not present

## 2020-06-11 LAB — BASIC METABOLIC PANEL
BUN/Creatinine Ratio: 27 — ABNORMAL HIGH (ref 10–24)
BUN: 27 mg/dL (ref 8–27)
CO2: 25 mmol/L (ref 20–29)
Calcium: 9.4 mg/dL (ref 8.6–10.2)
Chloride: 103 mmol/L (ref 96–106)
Creatinine, Ser: 1 mg/dL (ref 0.76–1.27)
GFR calc Af Amer: 87 mL/min/{1.73_m2} (ref >59)
GFR calc non Af Amer: 75 mL/min/{1.73_m2} (ref >59)
Glucose: 91 mg/dL (ref 65–99)
Potassium: 4.2 mmol/L (ref 3.5–5.2)
Sodium: 140 mmol/L (ref 134–144)

## 2020-06-17 ENCOUNTER — Encounter: Payer: Self-pay | Admitting: Physician Assistant

## 2020-06-17 NOTE — Progress Notes (Addendum)
Virtual Visit via Video Note   This visit type was conducted due to national recommendations for restrictions regarding the COVID-19 Pandemic (e.g. social distancing) in an effort to limit this patient's exposure and mitigate transmission in our community.  Due to his co-morbid illnesses, this patient is at least at moderate risk for complications without adequate follow up.  This format is felt to be most appropriate for this patient at this time.  All issues noted in this document were discussed and addressed.  A limited physical exam was performed with this format.  Please refer to the patient's chart for his consent to telehealth for [facility].       Date:  06/18/2020   ID:  Anthony Malone, DOB 09/24/1950, MRN [id number] The patient was identified using 2 identifiers.  Patient Location: Home Provider Location: Home Office   PCP:  Darrow Bussing, MD   [facility]  Cardiologist:  Armanda Magic, MD   Advanced Practice Provider:  No care team member to display Electrophysiologist:  None       Evaluation Performed:  Follow-Up Visit  Chief Complaint:  Follow-up (CAD, HTN)    Patient Profile: Anthony Malone is a 70 y.o. male with:  Hypertension   Hyperlipidemia   Coronary artery disease   Non-obstructive disease by CT 1/22; neg FFR  3 mm RLL pulmonary nodule   Gout   PVCs   Prior CV Studies: Coronary CTA 05/31/2020 Ascending aorta 40 mm RCA proximal 25-49, mid-distal 0-24 LM distal 0-24 RI proximal and mid 0-24 LAD proximal 25-49, mid 25-49; mild calcific plaque in D1 (unable to quantitate degree due to noise artifact and small caliber vessel) LCx assessment affected by noise artifact Calcium score 323 (63rd percentile) FFR: No hemodynamically significant stenosis  PA enlargement sugg PAH Trace bilat pleural fluid 3 mm RLL nodule >> f/u 12 mos   Myoview 01/06/2015 EF 54, no ischemia; low risk  Echocardiogram 10/13/2011 Mild  concentric LVH, EF 58.2, mild LAE, AV sclerosis, mild TR, mild PI, GR 1 DD  History of Present Illness:   Mr. Anthony Malone was last seen by Dr. Mayford Knife 05/11/2020. His blood pressure is uncontrolled. HCTZ was switched to chlorthalidone. He also had symptoms of chest pain.  He had PVCs on his ECG.  A coronary CTA demonstrated mild to mod non-obstructive CAD.  FFR was neg.  There was a small 3 mm RLL nodule and Dr. Mayford Knife recommended f/u with PCP.  There was also sugg of pulmonary hypertension.  Of note, an echocardiogram was ordered at his last OV but not yet done (scheduled 3/1).  Dr. Mayford Knife added metoprolol succinate to his medical regimen.  Fasting lipids are pending.  He is seen for f/u.  Overall, he has been doing well.  Since starting on metoprolol, he is feeling much better.  He has not had palpitations, significant shortness of breath, chest discomfort, syncope.   Past Medical History:  Diagnosis Date  . Aortic atherosclerosis (HCC)   . Chest pain    Dr Mayford Knife- normal echo-no ischemia on nuclear stress 6/12  . ED (erectile dysfunction)   . Gout   . High cholesterol   . History of EKG    LAFB  . Hypertension   . 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 Mild CAD   . Pulmonary nodule    History reviewed. No pertinent surgical history.   Current Meds  Medication Sig  . allopurinol (  ZYLOPRIM) 300 MG tablet Take 150 mg by mouth daily.   Marland Kitchen amLODipine (NORVASC) 5 MG tablet Take 5 mg by mouth daily.  Marland Kitchen aspirin 81 MG tablet Take 81 mg by mouth daily.  . chlorthalidone (HYGROTON) 50 MG tablet Take 1 tablet (50 mg total) by mouth daily.  Marland Kitchen doxazosin (CARDURA) 2 MG tablet TAKE 1 TABLET BY MOUTH EVERY DAY AT BEDTIME  . losartan (COZAAR) 100 MG tablet Take 1 tablet (100 mg total) by mouth daily.  . metoprolol succinate (TOPROL XL) 25 MG 24 hr tablet Take 1 tablet (25 mg total) by mouth daily.  . naproxen (NAPROSYN) 500 MG tablet Take 500 mg by mouth as  needed. As  Needed for joint pain with food     Allergies:   Ibuprofen   Social History   Tobacco Use  . Smoking status: Never Smoker  . Smokeless tobacco: Never Used  Substance Use Topics  . Alcohol use: Yes    Comment: rare  . Drug use: No     Family Hx: The patient's family history includes Breast cancer in his mother; CVA in his father; Hypertension in his father and mother.  ROS:   Please see the history of present illness.    Labs/Other Tests and Data Reviewed:    EKG:  No ECG reviewed.  Recent Labs: 06/11/2020: BUN 27; Creatinine, Ser 1.00; Potassium 4.2; Sodium 140   Recent Lipid Panel No results found for: CHOL, TRIG, HDL, CHOLHDL, LDLCALC, LDLDIRECT  Wt Readings from Last 3 Encounters:  06/18/20 280 lb (127 kg)  05/11/20 274 lb (124.3 kg)  05/13/19 295 lb (133.8 kg)     Risk Assessment/Calculations:      Objective:    Vital Signs:  BP 138/77   Pulse 68   Ht 6\' 1"  (1.854 m)   Wt 280 lb (127 kg)   BMI 36.94 kg/m    VITAL SIGNS:  reviewed GEN:  no acute distress EYES:  sclerae anicteric, EOMI - Extraocular Movements Intact RESPIRATORY:  Normal respiratory effort PSYCH:  normal affect  ASSESSMENT & PLAN:    1. Coronary artery disease involving native coronary artery of native heart without angina pectoris Mild to moderate nonobstructive disease by recent coronary CTA.  FFR did not demonstrate any hemodynamically significant stenoses.  He is not having anginal symptoms.  We discussed the role of risk reduction.  Continue aspirin therapy.  He does not want to take statin therapy.  Blood pressure is better controlled.  We discussed the importance of diet, exercise and weight loss.  He does not smoke and is not a diabetic.  Follow-up with Dr. in 6 months.  2. Essential hypertension Fair control.  He is not quite to target.  He does not really want to start any new medications.  I think it is reasonable to continue his current medications.  I have  encouraged him to continue with watching his diet, exercising and working on weight loss.  This will help improve his blood pressure and may eliminate the need to further titrate medications.    3. Hyperlipidemia, unspecified hyperlipidemia type Lipid panel pending.  As noted, he does not want to take statins.  We discussed alternative treatments such as ezetimibe, bempedoic acid, PCSK9 inhibitors and inclisiran.  He will continue to work on diet and exercise.  4. PVC's (premature ventricular contractions) Noted on recent electrocardiogram.  Since starting on low-dose metoprolol, he feels better.  Question if he may have been symptomatic with PVCs.  He does have an echocardiogram pending.       Time:   Today, I have spent 19 minutes with the patient with telehealth technology discussing the above problems.     Medication Adjustments/Labs and Tests Ordered: Current medicines are reviewed at length with the patient today.  Concerns regarding medicines are outlined above.   Tests Ordered: No orders of the defined types were placed in this encounter.   Medication Changes: No orders of the defined types were placed in this encounter.   Follow Up:  In Person in 6 month(s)  Signed, Tereso Newcomer, PA-C  06/18/2020 12:58 PM    [facility]

## 2020-06-17 NOTE — Progress Notes (Deleted)
{Choose 1 Note Type (Video or Telephone):[phone]}   The patient was identified using 2 identifiers.  Date:  06/17/2020   ID:  Anthony Malone, DOB 1950/05/08, MRN [id number]  {Patient [phone]::"Home"} {Provider Location:[phone]::"Home Office"}  PCP:  Darrow Bussing, MD  Cardiologist:  Armanda Magic, MD *** Electrophysiologist:  None   Evaluation Performed:  Follow-Up Visit  Chief Complaint:  ***  History of Present Illness:    Anthony Malone is a 70 y.o. male with HTN, LAFB, HLD, gout, nonobstructive CAD, aortic atherosclerosis, pulmonary nodule by Ct imaging who presents for follow-up of testing. He remotely saw Dr. Mayford Knife for chest pain and had normal echo and stress. More recently he presented back to clinic 05/2020 for re-evaluation of occasional chest pain with mixed features as well as SOB. EKG showed iRBBB+LAFB and a PVC couplet. Blood pressure was elevated and so HCTZ was changed to chlorthalidone and titrated. Coronary CTA showed Ca++ score 323 (63rd%ile), mild atherosclerosis of LAD/RCA, negative FFR, overread with PA enlargement suggestive of PAH, aortic atherosclerosis, and 71mm RLL pulm nodule.  Echo planned - PAH seen on CT Also needs reinforcement of pulm nodule seen Review [facility] labs/Lipid plan - statin should be considered  PVC symptoms?  Atypical chest pain/SOB Essential HTN Hyperlipidemia PVCs   Labs Independently Reviewed 06/12/19 K 4.2, Cr 1.0 03/2019 Hgb 15.2, LFTs wnl  Past Medical History:  Diagnosis Date  . Chest pain    Dr Mayford Knife- normal echo-no ischemia on nuclear stress 6/12  . ED (erectile dysfunction)   . Gout   . High cholesterol   . History of EKG    LAFB  . Hypertension    No past surgical history on file.   No outpatient medications have been marked as taking for the 06/18/20 encounter (Appointment) with [name], PA-C.     Allergies:   Ibuprofen   Social History   Tobacco Use  . Smoking status: Never Smoker   . Smokeless tobacco: Never Used  Substance Use Topics  . Alcohol use: Yes    Comment: rare  . Drug use: No     Family Hx: The patient's family history includes Breast cancer in his mother; CVA in his father; Hypertension in his father and mother.  ROS:   Please see the history of present illness.    *** All other systems reviewed and are negative.   Prior CV studies:   The following studies were reviewed today:  Cor CT 05/13/20  FINDINGS: A 120 kV prospective scan was triggered in the descending thoracic aorta at 111 HU's. Axial non-contrast 3 mm slices were carried out through the heart. The data set was analyzed on a dedicated work station and scored using the Agatson method. Gantry rotation speed was 250 msecs and collimation was .6 mm. No beta blockade and 0.8 mg of sl NTG was given. The 3D data set was reconstructed in 5% intervals of the 67-82 % of the R-R cycle. Diastolic phases were analyzed on a dedicated work station using MPR, MIP and VRT modes. The patient received 80 cc of contrast.  Aorta: Mildly dilated ascending aorta measuring 23mm at the bifurcation of the main pulmonary artery. No calcifications. No dissection.  Aortic Valve:  Trileaflet.  No calcifications.  Coronary Arteries:  Normal coronary origin.  Right dominance.  RCA is a large dominant artery that gives rise to PDA and PLVB. There is mild calcified plaque in the proximal RCA with associated stenosis of 25-49%. There is minimal calcified  plaque scattered throughout the mid and distal RCA with associated stenosis of 0-24%.  Left main is a large artery that gives rise to LAD, Ramus and LCX arteries. There is minimal calcified plaque in the distal LM with associated stenosis of 0-24%.  The Ramus is a moderate sized vessel with scattered minimal calcified plaque throughout the proximal and mid vessel with associated stenosis of 0-24%.  LAD is a large vessel. There is mild mixed  plaque in the proximal LAD with associated stenosis of 25-49%. There is mild calcified plaque in the mid LAD with associated stenosis of 25-49%. There is mild calcified plaque in the D1 but cannot quantitate degree due to noise artifact and small caliber of vessel.  LCX is a non-dominant artery that gives rise to one large OM1 branch. Significant noise artifact limits accurate assessment of plaque.  Other findings:  Normal pulmonary vein drainage into the left atrium.  Normal let atrial appendage without a thrombus.  Normal size of the pulmonary artery.  IMPRESSION: 1. Coronary calcium score of 323. This was 63rd percentile for age and sex matched control.  2.  Normal coronary origin with right dominance.  3.  Mild atherosclerosis of the LAD and RCA.  CAD RADS 2.  4.  Consider preventive therapy and risk factor modification.  5.  This study has been submitted for FFR flow analysis.  OVERREAD IMPRESSION: 1.  No acute findings in the imaged extracardiac chest. 2. Pulmonary artery enlargement suggests pulmonary arterial hypertension. 3. Trace bilateral pleural fluid. 4. Right lower lobe 3 mm pulmonary nodule. No follow-up needed if patient is low-risk. Non-contrast chest CT can be considered in 12 months if patient is high-risk. This recommendation follows the consensus statement: Guidelines for Management of Incidental Pulmonary Nodules Detected on CT Images: From the Fleischner Society 2017; Radiology 2017; 284:228-243. 5. Aortic Atherosclerosis (ICD10-I70.0).  Electronically Signed: By: Jeronimo Greaves M.D. On: 05/31/2020 08:52  FFRCT ANALYSIS  FINDINGS: FFRct analysis was performed on the original cardiac CT angiogram dataset. Diagrammatic representation of the FFRct analysis is provided in a separate PDF document in PACS. This dictation was created using the PDF document and an interactive 3D model of the results. 3D model is not available in the  EMR/PACS. Normal FFR range is >0.80.  1. Left Main: No significant stenosis. LM FFR = 0.99.  2. LAD: No significant stenosis. Proximal FFR = 0.95, Mid FFR = 0.90, Distal FFR = 0.79.  3. LCX: No significant stenosis. Proximal FFR = 0.99, Mid FFR = 0.96, Distal FFR = 0.96.  4. Ramus: No significant stenosis. Proximal FFR = 0.99, Mid FFR = 0.97, Distal FFR = 0.95.  5. RCA: No significant stenosis. Proximal FFR = 0. 0.99, Mid FFR = 0.95, Distal FFR = 0.96.  IMPRESSION: 1. Coronary CT FFR flow analysis demonstrates no hemodynamically significant flow limiting lesions.  Scanned echo 2013 - EF 58.2%, mild LVH, mild LAE, mild TR, mild PR, grade 1 DD    Labs/Other Tests and Data Reviewed:    EKG:  An ECG dated 05/11/20 was personally reviewed today and demonstrated:  NSR 775pbm, iRBBB, LAFB, prior septal infarct, occasional PVCs (one couplet)  Recent Labs: 06/11/2020: BUN 27; Creatinine, Ser 1.00; Potassium 4.2; Sodium 140   Recent Lipid Panel No results found for: CHOL, TRIG, HDL, CHOLHDL, LDLCALC, LDLDIRECT  Wt Readings from Last 3 Encounters:  05/11/20 274 lb (124.3 kg)  05/13/19 295 lb (133.8 kg)  03/20/18 288 lb 3.2 oz (130.7 kg)  Objective:    Vital Signs:  There were no vitals taken for this visit.   VS reviewed. General - male in no acute distress Pulm - No labored breathing, no coughing during visit, no audible wheezing, speaking in full sentences Neuro - A+Ox3, no slurred speech, answers questions appropriately Psych - Pleasant affect  ASSESSMENT & PLAN:    1. ***   Time:   Today, I have spent *** minutes with the patient with telehealth technology discussing the above problems.     Medication Adjustments/Labs and Tests Ordered: Current medicines are reviewed at length with the patient today.  Testing and concerns regarding medicines are outlined above.    Follow Up:  {F/U [phone]} {follow up:15908}  Signed, Dayna N Dunn, PA-C  ***need to change provider name given schedule change*** 06/17/2020 8:06 AM    [facility]

## 2020-06-18 ENCOUNTER — Telehealth (INDEPENDENT_AMBULATORY_CARE_PROVIDER_SITE_OTHER): Payer: Medicare Other | Admitting: Physician Assistant

## 2020-06-18 ENCOUNTER — Other Ambulatory Visit: Payer: Self-pay

## 2020-06-18 ENCOUNTER — Encounter: Payer: Self-pay | Admitting: Physician Assistant

## 2020-06-18 VITALS — BP 138/77 | HR 68 | Ht 73.0 in | Wt 280.0 lb

## 2020-06-18 DIAGNOSIS — E785 Hyperlipidemia, unspecified: Secondary | ICD-10-CM

## 2020-06-18 DIAGNOSIS — I493 Ventricular premature depolarization: Secondary | ICD-10-CM

## 2020-06-18 DIAGNOSIS — I2510 Atherosclerotic heart disease of native coronary artery without angina pectoris: Secondary | ICD-10-CM | POA: Diagnosis not present

## 2020-06-18 DIAGNOSIS — I10 Essential (primary) hypertension: Secondary | ICD-10-CM | POA: Diagnosis not present

## 2020-06-29 ENCOUNTER — Other Ambulatory Visit: Payer: Medicare Other

## 2020-06-29 ENCOUNTER — Ambulatory Visit (HOSPITAL_COMMUNITY): Payer: Medicare Other | Attending: Cardiovascular Disease

## 2020-06-29 ENCOUNTER — Telehealth: Payer: Self-pay

## 2020-06-29 ENCOUNTER — Other Ambulatory Visit: Payer: Self-pay

## 2020-06-29 DIAGNOSIS — I712 Thoracic aortic aneurysm, without rupture, unspecified: Secondary | ICD-10-CM

## 2020-06-29 DIAGNOSIS — E7800 Pure hypercholesterolemia, unspecified: Secondary | ICD-10-CM | POA: Diagnosis not present

## 2020-06-29 DIAGNOSIS — R079 Chest pain, unspecified: Secondary | ICD-10-CM | POA: Insufficient documentation

## 2020-06-29 LAB — ECHOCARDIOGRAM COMPLETE
Area-P 1/2: 2.1 cm2
P 1/2 time: 386 msec
S' Lateral: 4 cm

## 2020-06-29 LAB — LIPID PANEL
Chol/HDL Ratio: 4 ratio (ref 0.0–5.0)
Cholesterol, Total: 157 mg/dL (ref 100–199)
HDL: 39 mg/dL — ABNORMAL LOW (ref >39)
LDL Chol Calc (NIH): 95 mg/dL (ref 0–99)
Triglycerides: 127 mg/dL (ref 0–149)
VLDL Cholesterol Cal: 23 mg/dL (ref 5–40)

## 2020-06-29 LAB — ALT: ALT: 36 IU/L (ref 0–44)

## 2020-06-29 NOTE — Telephone Encounter (Signed)
The patient has been notified of the result and verbalized understanding.  All questions (if any) were answered. Theresia Majors, RN 06/29/2020 4:05 PM  Chest CTA has been ordered.

## 2020-06-29 NOTE — Telephone Encounter (Signed)
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

## 2020-07-01 ENCOUNTER — Telehealth: Payer: Self-pay

## 2020-07-01 MED ORDER — ROSUVASTATIN CALCIUM 5 MG PO TABS: 5.0000 mg | ORAL_TABLET | Freq: Every day | ORAL | 3 refills | Status: DC

## 2020-07-01 NOTE — Telephone Encounter (Signed)
Patient agrees to start rosuvastatin 5 mg daily

## 2020-07-01 NOTE — Telephone Encounter (Signed)
-----   Message from Olene Floss, RPH-CPP sent at 07/01/2020  7:31 AM EST ----- I would suggest rosuvastatin 5mg  daily ----- Message ----- From: , MD Sent: 06/30/2020   7:12 PM EST To: 08/30/2020, RPH-CPP, Olene Floss, RN  Melissa  can you make recommendations ----- Message ----- From: Theresia Majors, RN Sent: 06/30/2020   5:09 PM EST To: 08/30/2020, MD, Quintella Reichert, RPH-CPP  The patient has been notified of the result and verbalized understanding.    Patient will reconsider a statin if it is the lowest dose possible.

## 2020-07-06 ENCOUNTER — Other Ambulatory Visit: Payer: Medicare Other | Admitting: *Deleted

## 2020-07-06 ENCOUNTER — Other Ambulatory Visit: Payer: Self-pay

## 2020-07-06 DIAGNOSIS — I712 Thoracic aortic aneurysm, without rupture, unspecified: Secondary | ICD-10-CM

## 2020-07-06 NOTE — Addendum Note (Signed)
Addended by: Theresia Majors on: 07/06/2020 08:40 AM   Modules accepted: Orders

## 2020-07-07 LAB — BASIC METABOLIC PANEL
BUN/Creatinine Ratio: 25 — ABNORMAL HIGH (ref 10–24)
BUN: 28 mg/dL — ABNORMAL HIGH (ref 8–27)
CO2: 22 mmol/L (ref 20–29)
Calcium: 9.7 mg/dL (ref 8.6–10.2)
Chloride: 102 mmol/L (ref 96–106)
Creatinine, Ser: 1.1 mg/dL (ref 0.76–1.27)
Glucose: 82 mg/dL (ref 65–99)
Potassium: 4.2 mmol/L (ref 3.5–5.2)
Sodium: 142 mmol/L (ref 134–144)
eGFR: 72 mL/min/{1.73_m2} (ref >59)

## 2020-07-08 ENCOUNTER — Other Ambulatory Visit: Payer: Medicare Other

## 2020-07-15 ENCOUNTER — Other Ambulatory Visit: Payer: Self-pay

## 2020-07-15 ENCOUNTER — Ambulatory Visit (HOSPITAL_COMMUNITY)
Admission: RE | Admit: 2020-07-15 | Discharge: 2020-07-15 | Disposition: A | Discharge status: Home / Self Care | Payer: Medicare Other | Source: Ambulatory Visit | Attending: Cardiology | Admitting: Cardiology

## 2020-07-15 ENCOUNTER — Encounter (HOSPITAL_COMMUNITY): Payer: Self-pay

## 2020-07-15 DIAGNOSIS — I712 Thoracic aortic aneurysm, without rupture, unspecified: Secondary | ICD-10-CM

## 2020-07-15 DIAGNOSIS — I2510 Atherosclerotic heart disease of native coronary artery without angina pectoris: Secondary | ICD-10-CM | POA: Diagnosis not present

## 2020-07-15 DIAGNOSIS — I700 Atherosclerosis of aorta: Secondary | ICD-10-CM | POA: Diagnosis not present

## 2020-07-15 DIAGNOSIS — J9811 Atelectasis: Secondary | ICD-10-CM | POA: Diagnosis not present

## 2020-07-15 MED ORDER — IOHEXOL 350 MG/ML SOLN
80.0000 mL | Freq: Once | INTRAVENOUS | Status: AC | PRN
  Administered 2020-07-15: 80 mL via INTRAVENOUS

## 2020-07-18 ENCOUNTER — Encounter: Payer: Self-pay | Admitting: Cardiology

## 2020-07-18 DIAGNOSIS — I77819 Aortic ectasia, unspecified site: Secondary | ICD-10-CM | POA: Insufficient documentation

## 2020-09-13 ENCOUNTER — Telehealth: Payer: Self-pay

## 2020-09-13 DIAGNOSIS — Z006 Encounter for examination for normal comparison and control in clinical research program: Secondary | ICD-10-CM

## 2020-09-13 NOTE — Telephone Encounter (Signed)
Called patient for 90 day Identify phone call pt stated he is not having anymore cardiac symptoms but did follow up with PCP, I reminded the patient that we would be calling him back around January for a year follow up phone call.

## 2021-02-28 ENCOUNTER — Ambulatory Visit: Payer: Medicare Other | Admitting: Cardiology

## 2021-03-01 ENCOUNTER — Encounter: Payer: Self-pay | Admitting: Cardiology

## 2021-03-01 ENCOUNTER — Other Ambulatory Visit: Payer: Self-pay

## 2021-03-01 ENCOUNTER — Ambulatory Visit: Payer: Medicare Other | Admitting: Cardiology

## 2021-03-01 VITALS — BP 140/82 | HR 78 | Ht 72.0 in | Wt 285.0 lb

## 2021-03-01 DIAGNOSIS — I10 Essential (primary) hypertension: Secondary | ICD-10-CM

## 2021-03-01 DIAGNOSIS — I2510 Atherosclerotic heart disease of native coronary artery without angina pectoris: Secondary | ICD-10-CM

## 2021-03-01 DIAGNOSIS — E7800 Pure hypercholesterolemia, unspecified: Secondary | ICD-10-CM | POA: Diagnosis not present

## 2021-03-01 DIAGNOSIS — I2583 Coronary atherosclerosis due to lipid rich plaque: Secondary | ICD-10-CM

## 2021-03-01 DIAGNOSIS — I493 Ventricular premature depolarization: Secondary | ICD-10-CM | POA: Diagnosis not present

## 2021-03-01 LAB — COMPREHENSIVE METABOLIC PANEL
ALT: 48 IU/L — ABNORMAL HIGH (ref 0–44)
AST: 39 IU/L (ref 0–40)
Albumin/Globulin Ratio: 1.8 (ref 1.2–2.2)
Albumin: 4.4 g/dL (ref 3.7–4.7)
Alkaline Phosphatase: 76 IU/L (ref 44–121)
BUN/Creatinine Ratio: 24 (ref 10–24)
BUN: 23 mg/dL (ref 8–27)
Bilirubin Total: 0.9 mg/dL (ref 0.0–1.2)
CO2: 25 mmol/L (ref 20–29)
Calcium: 9.6 mg/dL (ref 8.6–10.2)
Chloride: 102 mmol/L (ref 96–106)
Creatinine, Ser: 0.96 mg/dL (ref 0.76–1.27)
Globulin, Total: 2.4 g/dL (ref 1.5–4.5)
Glucose: 106 mg/dL — ABNORMAL HIGH (ref 70–99)
Potassium: 4.2 mmol/L (ref 3.5–5.2)
Sodium: 139 mmol/L (ref 134–144)
Total Protein: 6.8 g/dL (ref 6.0–8.5)
eGFR: 84 mL/min/{1.73_m2} (ref >59)

## 2021-03-01 LAB — LIPID PANEL
Chol/HDL Ratio: 3.2 ratio (ref 0.0–5.0)
Cholesterol, Total: 108 mg/dL (ref 100–199)
HDL: 34 mg/dL — ABNORMAL LOW (ref >39)
LDL Chol Calc (NIH): 50 mg/dL (ref 0–99)
Triglycerides: 135 mg/dL (ref 0–149)
VLDL Cholesterol Cal: 24 mg/dL (ref 5–40)

## 2021-03-01 MED ORDER — METOPROLOL SUCCINATE ER 25 MG PO TB24: 25.0000 mg | ORAL_TABLET | Freq: Every day | ORAL | 3 refills | Status: DC

## 2021-03-01 MED ORDER — LOSARTAN POTASSIUM 100 MG PO TABS: 100.0000 mg | ORAL_TABLET | Freq: Every day | ORAL | 3 refills | Status: DC

## 2021-03-01 MED ORDER — DOXAZOSIN MESYLATE 2 MG PO TABS: 2.0000 mg | ORAL_TABLET | Freq: Every day | ORAL | 3 refills | Status: DC

## 2021-03-01 MED ORDER — AMLODIPINE BESYLATE 5 MG PO TABS: 5.0000 mg | ORAL_TABLET | Freq: Every day | ORAL | 3 refills | Status: DC

## 2021-03-01 MED ORDER — ROSUVASTATIN CALCIUM 5 MG PO TABS: 5.0000 mg | ORAL_TABLET | Freq: Every day | ORAL | 3 refills | Status: DC

## 2021-03-01 MED ORDER — CHLORTHALIDONE 50 MG PO TABS: 50.0000 mg | ORAL_TABLET | Freq: Every day | ORAL | 3 refills | Status: DC

## 2021-03-01 NOTE — Patient Instructions (Signed)
Medication Instructions:  Your physician recommends that you continue on your current medications as directed. Please refer to the Current Medication list given to you today.  *If you need a refill on your cardiac medications before your next appointment, please call your pharmacy*  Lab Work: TODAY: CMET and FLP If you have labs (blood work) drawn today and your tests are completely normal, you will receive your results only by: MyChart Message (if you have MyChart) OR A paper copy in the mail If you have any lab test that is abnormal or we need to change your treatment, we will call you to review the results.  Follow-Up: At [facility], you and your health needs are our priority.  As part of our continuing mission to provide you with exceptional heart care, we have created designated Provider Care Teams.  These Care Teams include your primary Cardiologist (physician) and Advanced Practice Providers (APPs -  Physician Assistants and Nurse Practitioners) who all work together to provide you with the care you need, when you need it.  Your next appointment:   1 year(s)  The format for your next appointment:   In Person  Provider:   You may see Traci Turner, MD or one of the following Advanced Practice Providers on your designated Care Team:   Dayna Dunn, PA-C Michele Lenze, PA-C  

## 2021-03-01 NOTE — Addendum Note (Signed)
Addended by: Theresia Majors on: 03/01/2021 08:43 AM   Modules accepted: Orders

## 2021-03-01 NOTE — Progress Notes (Signed)
Cardiology Office Note:    Date:  03/01/2021   ID:  Anthony Malone, DOB [birth year], MRN [id number]  PCP:  Darrow Bussing, MD  Cardiologist:  Armanda Magic, MD    Referring MD: Darrow Bussing, MD   Chief Complaint  Patient presents with   Coronary Artery Disease   Hypertension   Hyperlipidemia    History of Present Illness:    Anthony Malone is a 70 y.o. male with a hx of HTN and chest pain.  He had a stress test for some exertional fatigue which was normal.  Coronary CTA was done in January 2022 for atypical symptoms showing a coronary calcium score 323.  There was mild CAD of the RCA and proximal and mid LAD with 25 to 49% proximal RCA stenosis and proximal LAD stenosis with normal FFR.  He is here today for followup and is doing well.  He denies any chest pain or pressure, SOB, DOE (except with extreme exertion or going up a lot of steps), PND, orthopnea, LE edema, dizziness, palpitations or syncope. He is compliant with his meds and is tolerating meds with no SE.      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  Current Medications: Current Meds  Medication Sig   allopurinol (ZYLOPRIM) 300 MG tablet Take 150 mg by mouth daily.    amLODipine (NORVASC) 5 MG tablet Take 5 mg by mouth daily.   aspirin 81 MG tablet Take 81 mg by mouth daily.   chlorthalidone (HYGROTON) 50 MG tablet Take 1 tablet (50 mg total) by mouth daily.   doxazosin (CARDURA) 2 MG tablet TAKE 1 TABLET BY MOUTH EVERY DAY AT BEDTIME   losartan (COZAAR) 100 MG tablet  Take 1 tablet (100 mg total) by mouth daily.   metoprolol succinate (TOPROL XL) 25 MG 24 hr tablet Take 1 tablet (25 mg total) by mouth daily.   naproxen (NAPROSYN) 500 MG tablet Take 500 mg by mouth as needed. As  Needed for joint pain with food   rosuvastatin (CRESTOR) 5 MG tablet Take 1 tablet (5 mg total) by mouth daily.     Allergies:   Ibuprofen   Social History   Socioeconomic History   Marital status: Married    Spouse name: Not on file   Number of children: Not on file   Years of education: Not on file   Highest education level: Not on file  Occupational History   Not on file  Tobacco Use   Smoking status: Never   Smokeless tobacco: Never  Vaping Use   Vaping Use: Never used  Substance and Sexual Activity   Alcohol use: Yes    Comment: rare   Drug use: No   Sexual activity: Not on file  Other Topics Concern   Not on file  Social History Narrative   Not on file   Social Determinants of Health   Financial Resource  Strain: Not on file  Food Insecurity: Not on file  Transportation Needs: Not on file  Physical Activity: Not on file  Stress: Not on file  Social Connections: Not on file     Family History: The patient's family history includes Breast cancer in his mother; CVA in his father; Hypertension in his father and mother.  ROS:   Please see the history of present illness.    ROS  All other systems reviewed and negative.   EKGs/Labs/Other Studies Reviewed:    The following studies were reviewed today: none  EKG:  EKG is  ordered today.  The ekg ordered today demonstrates NSR with iRBBB and LAFB  Recent Labs: 06/29/2020: ALT 36 07/06/2020: BUN 28; Creatinine, Ser 1.10; Potassium 4.2; Sodium 142   Recent Lipid Panel    Component Value Date/Time   CHOL 157 06/29/2020 0822   TRIG 127 06/29/2020 0822   HDL 39 (L) 06/29/2020 0822   CHOLHDL 4.0 06/29/2020 0822   LDLCALC 95 06/29/2020 0822    Physical Exam:    VS:  BP 140/82   Pulse 78   Ht 6'  (1.829 m)   Wt 285 lb (129.3 kg)   SpO2 97%   BMI 38.65 kg/m     Wt Readings from Last 3 Encounters:  03/01/21 285 lb (129.3 kg)  06/18/20 280 lb (127 kg)  05/11/20 274 lb (124.3 kg)    GEN: Well nourished, well developed in no acute distress HEENT: Normal NECK: No JVD; No carotid bruits LYMPHATICS: No lymphadenopathy CARDIAC:RRR, no murmurs, rubs, gallops RESPIRATORY:  Clear to auscultation without rales, wheezing or rhonchi  ABDOMEN: Soft, non-tender, non-distended MUSCULOSKELETAL:  No edema; No deformity  SKIN: Warm and dry NEUROLOGIC:  Alert and oriented x 3 PSYCHIATRIC:  Normal affect   ASSESSMENT:    1. Essential hypertension, benign   2. Pure hypercholesterolemia   3. PVC's (premature ventricular contractions)   4. Coronary artery disease due to lipid rich plaque    PLAN:    In order of problems listed above:  1.  HTN -BP is adequately controlled on exam today -Continue prescription drug management with amlodipine 5 mg daily, Toprol XL 25mg  daily, chlorthalidone 50 mg daily, doxazosin 2 mg nightly, losartan 100 mg daily with as needed refills  -cannot increase amlodipine due to LE edema on higher doses -I have personally reviewed and interpreted outside labs performed by patient's PCP which showed serum creatinine 1.1 and potassium 4.2 in March 2022 -Repeat bmet today  2.  HLD -followed by PCP -LDL goal is less than 70  -Continue prescription drug management with Crestor 5 mg daily with as needed refills -check CMET and FLP  3.  PVCs -Noted on EKG in the past -asymptomatic  4.  ASCAD -Coronary CTA was done in January 2022 for atypical symptoms showing a coronary calcium score 323.  There was mild CAD of the RCA and proximal and mid LAD with 25 to 49% proximal RCA stenosis and proximal LAD stenosis with normal FFR. -He denies any anginal symptoms -Continue aspirin 81 mg daily, Toprol-XL 25 mg daily and statin therapy   Medication Adjustments/Labs and  Tests Ordered: Current medicines are reviewed at length with the patient today.  Concerns regarding medicines are outlined above.  Orders Placed This Encounter  Procedures   EKG 12-Lead    No orders of the defined types were placed in this encounter.   Signed, February 2022, MD  03/01/2021 8:38 AM    [facility] 

## 2021-05-04 DIAGNOSIS — R7309 Other abnormal glucose: Secondary | ICD-10-CM | POA: Diagnosis not present

## 2021-05-04 DIAGNOSIS — E7800 Pure hypercholesterolemia, unspecified: Secondary | ICD-10-CM | POA: Diagnosis not present

## 2021-05-04 DIAGNOSIS — Z79899 Other long term (current) drug therapy: Secondary | ICD-10-CM | POA: Diagnosis not present

## 2021-05-04 DIAGNOSIS — M109 Gout, unspecified: Secondary | ICD-10-CM | POA: Diagnosis not present

## 2021-05-04 DIAGNOSIS — I10 Essential (primary) hypertension: Secondary | ICD-10-CM | POA: Diagnosis not present

## 2021-05-04 DIAGNOSIS — Z0001 Encounter for general adult medical examination with abnormal findings: Secondary | ICD-10-CM | POA: Diagnosis not present

## 2021-05-11 DIAGNOSIS — Z0001 Encounter for general adult medical examination with abnormal findings: Secondary | ICD-10-CM | POA: Diagnosis not present

## 2021-05-15 ENCOUNTER — Other Ambulatory Visit: Payer: Self-pay | Admitting: Cardiology

## 2021-10-27 ENCOUNTER — Other Ambulatory Visit: Payer: Self-pay | Admitting: Family Medicine

## 2021-10-27 DIAGNOSIS — I10 Essential (primary) hypertension: Secondary | ICD-10-CM | POA: Diagnosis not present

## 2021-10-27 DIAGNOSIS — R911 Solitary pulmonary nodule: Secondary | ICD-10-CM

## 2021-11-30 ENCOUNTER — Other Ambulatory Visit: Payer: Medicare Other

## 2021-12-13 DIAGNOSIS — H25813 Combined forms of age-related cataract, bilateral: Secondary | ICD-10-CM | POA: Diagnosis not present

## 2021-12-13 DIAGNOSIS — H5213 Myopia, bilateral: Secondary | ICD-10-CM | POA: Diagnosis not present

## 2021-12-13 DIAGNOSIS — H524 Presbyopia: Secondary | ICD-10-CM | POA: Diagnosis not present

## 2021-12-19 ENCOUNTER — Ambulatory Visit
Admission: RE | Admit: 2021-12-19 | Discharge: 2021-12-19 | Disposition: A | Discharge status: Home / Self Care | Payer: Medicare Other | Source: Ambulatory Visit | Attending: Family Medicine | Admitting: Family Medicine

## 2021-12-19 DIAGNOSIS — R911 Solitary pulmonary nodule: Secondary | ICD-10-CM | POA: Diagnosis not present

## 2021-12-19 DIAGNOSIS — I700 Atherosclerosis of aorta: Secondary | ICD-10-CM | POA: Diagnosis not present

## 2022-02-01 DIAGNOSIS — H25012 Cortical age-related cataract, left eye: Secondary | ICD-10-CM | POA: Diagnosis not present

## 2022-02-01 DIAGNOSIS — H2512 Age-related nuclear cataract, left eye: Secondary | ICD-10-CM | POA: Diagnosis not present

## 2022-02-01 DIAGNOSIS — H2511 Age-related nuclear cataract, right eye: Secondary | ICD-10-CM | POA: Diagnosis not present

## 2022-02-01 DIAGNOSIS — H25811 Combined forms of age-related cataract, right eye: Secondary | ICD-10-CM | POA: Diagnosis not present

## 2022-02-13 ENCOUNTER — Other Ambulatory Visit: Payer: Self-pay | Admitting: Cardiology

## 2022-02-15 DIAGNOSIS — H2512 Age-related nuclear cataract, left eye: Secondary | ICD-10-CM | POA: Diagnosis not present

## 2022-02-15 DIAGNOSIS — H25812 Combined forms of age-related cataract, left eye: Secondary | ICD-10-CM | POA: Diagnosis not present

## 2022-04-29 ENCOUNTER — Encounter (HOSPITAL_COMMUNITY): Payer: Self-pay | Admitting: Internal Medicine

## 2022-04-29 ENCOUNTER — Inpatient Hospital Stay (HOSPITAL_COMMUNITY)
Admission: EM | Admit: 2022-04-29 | Discharge: 2022-05-02 | DRG: 871 | Disposition: A | Discharge status: Home Health | Payer: Medicare Other | Attending: Internal Medicine | Admitting: Internal Medicine

## 2022-04-29 ENCOUNTER — Other Ambulatory Visit: Payer: Self-pay

## 2022-04-29 ENCOUNTER — Emergency Department (HOSPITAL_COMMUNITY): Payer: Medicare Other

## 2022-04-29 DIAGNOSIS — Z1152 Encounter for screening for COVID-19: Secondary | ICD-10-CM | POA: Diagnosis not present

## 2022-04-29 DIAGNOSIS — Z7982 Long term (current) use of aspirin: Secondary | ICD-10-CM

## 2022-04-29 DIAGNOSIS — J101 Influenza due to other identified influenza virus with other respiratory manifestations: Secondary | ICD-10-CM | POA: Diagnosis present

## 2022-04-29 DIAGNOSIS — E7800 Pure hypercholesterolemia, unspecified: Secondary | ICD-10-CM | POA: Diagnosis present

## 2022-04-29 DIAGNOSIS — R0603 Acute respiratory distress: Principal | ICD-10-CM

## 2022-04-29 DIAGNOSIS — M170 Bilateral primary osteoarthritis of knee: Secondary | ICD-10-CM | POA: Diagnosis present

## 2022-04-29 DIAGNOSIS — A419 Sepsis, unspecified organism: Secondary | ICD-10-CM | POA: Diagnosis present

## 2022-04-29 DIAGNOSIS — E876 Hypokalemia: Secondary | ICD-10-CM | POA: Diagnosis present

## 2022-04-29 DIAGNOSIS — Z803 Family history of malignant neoplasm of breast: Secondary | ICD-10-CM | POA: Diagnosis not present

## 2022-04-29 DIAGNOSIS — I7121 Aneurysm of the ascending aorta, without rupture: Secondary | ICD-10-CM | POA: Diagnosis present

## 2022-04-29 DIAGNOSIS — I110 Hypertensive heart disease with heart failure: Secondary | ICD-10-CM | POA: Diagnosis present

## 2022-04-29 DIAGNOSIS — R6520 Severe sepsis without septic shock: Secondary | ICD-10-CM

## 2022-04-29 DIAGNOSIS — I2510 Atherosclerotic heart disease of native coronary artery without angina pectoris: Secondary | ICD-10-CM | POA: Diagnosis present

## 2022-04-29 DIAGNOSIS — A4189 Other specified sepsis: Principal | ICD-10-CM | POA: Diagnosis present

## 2022-04-29 DIAGNOSIS — I77819 Aortic ectasia, unspecified site: Secondary | ICD-10-CM

## 2022-04-29 DIAGNOSIS — Z79899 Other long term (current) drug therapy: Secondary | ICD-10-CM

## 2022-04-29 DIAGNOSIS — I10 Essential (primary) hypertension: Secondary | ICD-10-CM | POA: Diagnosis not present

## 2022-04-29 DIAGNOSIS — I700 Atherosclerosis of aorta: Secondary | ICD-10-CM | POA: Diagnosis not present

## 2022-04-29 DIAGNOSIS — I5032 Chronic diastolic (congestive) heart failure: Secondary | ICD-10-CM | POA: Diagnosis present

## 2022-04-29 DIAGNOSIS — N289 Disorder of kidney and ureter, unspecified: Secondary | ICD-10-CM

## 2022-04-29 DIAGNOSIS — R0602 Shortness of breath: Secondary | ICD-10-CM | POA: Diagnosis not present

## 2022-04-29 DIAGNOSIS — Z8249 Family history of ischemic heart disease and other diseases of the circulatory system: Secondary | ICD-10-CM

## 2022-04-29 DIAGNOSIS — I2583 Coronary atherosclerosis due to lipid rich plaque: Secondary | ICD-10-CM

## 2022-04-29 DIAGNOSIS — Z6841 Body Mass Index (BMI) 40.0 and over, adult: Secondary | ICD-10-CM

## 2022-04-29 DIAGNOSIS — M109 Gout, unspecified: Secondary | ICD-10-CM | POA: Diagnosis not present

## 2022-04-29 DIAGNOSIS — R000 Tachycardia, unspecified: Secondary | ICD-10-CM | POA: Diagnosis not present

## 2022-04-29 DIAGNOSIS — R059 Cough, unspecified: Secondary | ICD-10-CM | POA: Diagnosis not present

## 2022-04-29 DIAGNOSIS — J9601 Acute respiratory failure with hypoxia: Secondary | ICD-10-CM | POA: Diagnosis present

## 2022-04-29 DIAGNOSIS — Z823 Family history of stroke: Secondary | ICD-10-CM | POA: Diagnosis not present

## 2022-04-29 DIAGNOSIS — R7401 Elevation of levels of liver transaminase levels: Secondary | ICD-10-CM | POA: Diagnosis not present

## 2022-04-29 DIAGNOSIS — R7989 Other specified abnormal findings of blood chemistry: Secondary | ICD-10-CM | POA: Diagnosis not present

## 2022-04-29 DIAGNOSIS — R0609 Other forms of dyspnea: Secondary | ICD-10-CM | POA: Diagnosis not present

## 2022-04-29 DIAGNOSIS — E860 Dehydration: Secondary | ICD-10-CM | POA: Diagnosis present

## 2022-04-29 DIAGNOSIS — N179 Acute kidney failure, unspecified: Secondary | ICD-10-CM | POA: Diagnosis present

## 2022-04-29 DIAGNOSIS — E785 Hyperlipidemia, unspecified: Secondary | ICD-10-CM | POA: Diagnosis present

## 2022-04-29 DIAGNOSIS — Z886 Allergy status to analgesic agent status: Secondary | ICD-10-CM

## 2022-04-29 DIAGNOSIS — E66813 Obesity, class 3: Secondary | ICD-10-CM | POA: Diagnosis present

## 2022-04-29 DIAGNOSIS — I77810 Thoracic aortic ectasia: Secondary | ICD-10-CM | POA: Diagnosis present

## 2022-04-29 LAB — I-STAT VENOUS BLOOD GAS, ED
Acid-base deficit: 2 mmol/L (ref 0.0–2.0)
Bicarbonate: 21.7 mmol/L (ref 20.0–28.0)
Calcium, Ion: 1.08 mmol/L — ABNORMAL LOW (ref 1.15–1.40)
HCT: 38 % — ABNORMAL LOW (ref 39.0–52.0)
Hemoglobin: 12.9 g/dL — ABNORMAL LOW (ref 13.0–17.0)
O2 Saturation: 91 %
Potassium: 3 mmol/L — ABNORMAL LOW (ref 3.5–5.1)
Sodium: 136 mmol/L (ref 135–145)
TCO2: 23 mmol/L (ref 22–32)
pCO2, Ven: 34.6 mmHg — ABNORMAL LOW (ref 44–60)
pH, Ven: 7.405 (ref 7.25–7.43)
pO2, Ven: 60 mmHg — ABNORMAL HIGH (ref 32–45)

## 2022-04-29 LAB — COMPREHENSIVE METABOLIC PANEL
ALT: 64 U/L — ABNORMAL HIGH (ref 0–44)
AST: 165 U/L — ABNORMAL HIGH (ref 15–41)
Albumin: 3.4 g/dL — ABNORMAL LOW (ref 3.5–5.0)
Alkaline Phosphatase: 33 U/L — ABNORMAL LOW (ref 38–126)
Anion gap: 15 (ref 5–15)
BUN: 24 mg/dL — ABNORMAL HIGH (ref 8–23)
CO2: 21 mmol/L — ABNORMAL LOW (ref 22–32)
Calcium: 8.6 mg/dL — ABNORMAL LOW (ref 8.9–10.3)
Chloride: 100 mmol/L (ref 98–111)
Creatinine, Ser: 1.2 mg/dL (ref 0.61–1.24)
GFR, Estimated: >60 mL/min (ref >60)
Glucose, Bld: 136 mg/dL — ABNORMAL HIGH (ref 70–99)
Potassium: 3 mmol/L — ABNORMAL LOW (ref 3.5–5.1)
Sodium: 136 mmol/L (ref 135–145)
Total Bilirubin: 1.5 mg/dL — ABNORMAL HIGH (ref 0.3–1.2)
Total Protein: 6.5 g/dL (ref 6.5–8.1)

## 2022-04-29 LAB — CBC WITH DIFFERENTIAL/PLATELET
Abs Immature Granulocytes: 0 10*3/uL (ref 0.00–0.07)
Basophils Absolute: 0 10*3/uL (ref 0.0–0.1)
Basophils Relative: 0 %
Eosinophils Absolute: 0 10*3/uL (ref 0.0–0.5)
Eosinophils Relative: 0 %
HCT: 38.7 % — ABNORMAL LOW (ref 39.0–52.0)
Hemoglobin: 12.7 g/dL — ABNORMAL LOW (ref 13.0–17.0)
Lymphocytes Relative: 24 %
Lymphs Abs: 0.9 10*3/uL (ref 0.7–4.0)
MCH: 28.2 pg (ref 26.0–34.0)
MCHC: 32.8 g/dL (ref 30.0–36.0)
MCV: 86 fL (ref 80.0–100.0)
Monocytes Absolute: 0.7 10*3/uL (ref 0.1–1.0)
Monocytes Relative: 18 %
Neutro Abs: 2.3 10*3/uL (ref 1.7–7.7)
Neutrophils Relative %: 58 %
Platelets: ADEQUATE 10*3/uL (ref 150–400)
RBC: 4.5 MIL/uL (ref 4.22–5.81)
RDW: 13.2 % (ref 11.5–15.5)
WBC: 3.9 10*3/uL — ABNORMAL LOW (ref 4.0–10.5)
nRBC: 0 % (ref 0.0–0.2)

## 2022-04-29 LAB — RESP PANEL BY RT-PCR (RSV, FLU A&B, COVID)  RVPGX2
Influenza A by PCR: POSITIVE — AB
Influenza B by PCR: NEGATIVE
Resp Syncytial Virus by PCR: NEGATIVE
SARS Coronavirus 2 by RT PCR: NEGATIVE

## 2022-04-29 LAB — LACTIC ACID, PLASMA
Lactic Acid, Venous: 1.8 mmol/L (ref 0.5–1.9)
Lactic Acid, Venous: 1.3 mmol/L (ref 0.5–1.9)

## 2022-04-29 LAB — BRAIN NATRIURETIC PEPTIDE: B Natriuretic Peptide: 56 pg/mL (ref 0.0–100.0)

## 2022-04-29 LAB — PROCALCITONIN: Procalcitonin: 2.49 ng/mL

## 2022-04-29 LAB — D-DIMER, QUANTITATIVE: D-Dimer, Quant: 1.88 ug/mL-FEU — ABNORMAL HIGH (ref 0.00–0.50)

## 2022-04-29 MED ORDER — GUAIFENESIN-DM 100-10 MG/5ML PO SYRP
5.0000 mL | ORAL_SOLUTION | ORAL | Status: DC | PRN
  Administered 2022-04-29 – 2022-04-30 (×2): 5 mL via ORAL
  Filled 2022-04-29 (×2): qty 5

## 2022-04-29 MED ORDER — SODIUM CHLORIDE 0.9 % IV SOLN
1000.0000 mL | INTRAVENOUS | Status: AC
  Administered 2022-04-29: 1000 mL via INTRAVENOUS

## 2022-04-29 MED ORDER — ASPIRIN 81 MG PO TABS: 81.0000 mg | ORAL_TABLET | Freq: Every day | ORAL | Status: DC

## 2022-04-29 MED ORDER — ASPIRIN 81 MG PO TBEC
81.0000 mg | DELAYED_RELEASE_TABLET | Freq: Every day | ORAL | Status: DC
  Administered 2022-04-29 – 2022-05-02 (×4): 81 mg via ORAL
  Filled 2022-04-29 (×4): qty 1

## 2022-04-29 MED ORDER — CALCIUM GLUCONATE-NACL 1-0.675 GM/50ML-% IV SOLN
1.0000 g | Freq: Once | INTRAVENOUS | Status: AC
  Administered 2022-04-29: 1000 mg via INTRAVENOUS
  Filled 2022-04-29: qty 50

## 2022-04-29 MED ORDER — ALBUTEROL SULFATE (2.5 MG/3ML) 0.083% IN NEBU
2.5000 mg | INHALATION_SOLUTION | RESPIRATORY_TRACT | Status: DC | PRN
  Administered 2022-04-29 – 2022-04-30 (×2): 2.5 mg via RESPIRATORY_TRACT
  Filled 2022-04-29 (×2): qty 3

## 2022-04-29 MED ORDER — SODIUM CHLORIDE 0.9 % IV BOLUS (SEPSIS)
500.0000 mL | Freq: Once | INTRAVENOUS | Status: AC
  Administered 2022-04-29: 500 mL via INTRAVENOUS

## 2022-04-29 MED ORDER — LOSARTAN POTASSIUM 50 MG PO TABS
100.0000 mg | ORAL_TABLET | Freq: Every day | ORAL | Status: DC
  Administered 2022-04-30 – 2022-05-02 (×3): 100 mg via ORAL
  Filled 2022-04-29 (×3): qty 2

## 2022-04-29 MED ORDER — ROSUVASTATIN CALCIUM 5 MG PO TABS
5.0000 mg | ORAL_TABLET | Freq: Every evening | ORAL | Status: DC
  Administered 2022-04-30 – 2022-05-01 (×2): 5 mg via ORAL
  Filled 2022-04-29 (×2): qty 1

## 2022-04-29 MED ORDER — ACETAMINOPHEN 650 MG RE SUPP: 650.0000 mg | Freq: Four times a day (QID) | RECTAL | Status: DC | PRN

## 2022-04-29 MED ORDER — METOPROLOL SUCCINATE ER 25 MG PO TB24
25.0000 mg | ORAL_TABLET | Freq: Every day | ORAL | Status: DC
  Administered 2022-04-29 – 2022-05-01 (×3): 25 mg via ORAL
  Filled 2022-04-29 (×3): qty 1

## 2022-04-29 MED ORDER — POTASSIUM CHLORIDE CRYS ER 20 MEQ PO TBCR
40.0000 meq | EXTENDED_RELEASE_TABLET | ORAL | Status: AC
  Administered 2022-04-29: 40 meq via ORAL
  Filled 2022-04-29: qty 2

## 2022-04-29 MED ORDER — AMLODIPINE BESYLATE 5 MG PO TABS
5.0000 mg | ORAL_TABLET | Freq: Every day | ORAL | Status: DC
  Administered 2022-04-29 – 2022-05-02 (×4): 5 mg via ORAL
  Filled 2022-04-29 (×4): qty 1

## 2022-04-29 MED ORDER — OSELTAMIVIR PHOSPHATE 75 MG PO CAPS
75.0000 mg | ORAL_CAPSULE | Freq: Once | ORAL | Status: AC
  Administered 2022-04-29: 75 mg via ORAL
  Filled 2022-04-29: qty 1

## 2022-04-29 MED ORDER — SODIUM CHLORIDE 0.9 % IV SOLN
1000.0000 mL | INTRAVENOUS | Status: DC
  Administered 2022-04-29: 1000 mL via INTRAVENOUS

## 2022-04-29 MED ORDER — ENOXAPARIN SODIUM 40 MG/0.4ML IJ SOSY
40.0000 mg | PREFILLED_SYRINGE | INTRAMUSCULAR | Status: DC
  Administered 2022-04-29 – 2022-05-02 (×4): 40 mg via SUBCUTANEOUS
  Filled 2022-04-29 (×4): qty 0.4

## 2022-04-29 MED ORDER — SODIUM CHLORIDE 0.9 % IV SOLN: 1000.0000 mL | INTRAVENOUS | Status: DC

## 2022-04-29 MED ORDER — DOXAZOSIN MESYLATE 2 MG PO TABS
2.0000 mg | ORAL_TABLET | Freq: Every day | ORAL | Status: DC
  Administered 2022-04-30 – 2022-05-02 (×3): 2 mg via ORAL
  Filled 2022-04-29 (×5): qty 1

## 2022-04-29 MED ORDER — OSELTAMIVIR PHOSPHATE 75 MG PO CAPS
75.0000 mg | ORAL_CAPSULE | Freq: Two times a day (BID) | ORAL | Status: DC
  Administered 2022-04-29 – 2022-05-02 (×6): 75 mg via ORAL
  Filled 2022-04-29 (×7): qty 1

## 2022-04-29 MED ORDER — SODIUM CHLORIDE 0.9% FLUSH
3.0000 mL | Freq: Two times a day (BID) | INTRAVENOUS | Status: DC
  Administered 2022-04-29 – 2022-05-02 (×4): 3 mL via INTRAVENOUS

## 2022-04-29 MED ORDER — ACETAMINOPHEN 325 MG PO TABS
650.0000 mg | ORAL_TABLET | Freq: Four times a day (QID) | ORAL | Status: DC | PRN
  Filled 2022-04-29: qty 2

## 2022-04-29 MED ORDER — ACETAMINOPHEN 500 MG PO TABS
1000.0000 mg | ORAL_TABLET | Freq: Once | ORAL | Status: AC
  Administered 2022-04-29: 1000 mg via ORAL
  Filled 2022-04-29: qty 2

## 2022-04-29 MED ORDER — ONDANSETRON HCL 4 MG/2ML IJ SOLN: 4.0000 mg | Freq: Four times a day (QID) | INTRAMUSCULAR | Status: DC | PRN

## 2022-04-29 MED ORDER — ONDANSETRON HCL 4 MG PO TABS: 4.0000 mg | ORAL_TABLET | Freq: Four times a day (QID) | ORAL | Status: DC | PRN

## 2022-04-29 NOTE — H&P (Addendum)
History and Physical    Patient: Anthony Malone:[id number] DOB: 1950-06-08 DOA: 04/29/2022 DOS: the patient was seen and examined on 04/29/2022 PCP: Darrow Bussing, MD  Patient coming from: Home via EMS  Chief Complaint:  Chief Complaint  Patient presents with   Shortness of Breath   HPI: Anthony Malone is a 71 y.o. male with medical history significant of hypertension, hyperlipidemia, CAD, CHF, AAA, and obesity who presents with complaints of shortness of breath and cough over the last week.  Him and his wife had visited his son and family and [place] for Christmas which is a 3-1/2  to 4-hour drive, but reported stopping at least once to get down there.  On the way back they stopped [place] which is about a 2-hour drive.  He notes that his son and family had been sick with something, but did not think that he got symptoms from them.  Since getting back home he reports having mildly productive cough with coughing spells.  Associated symptoms included myalgias, loose stools, and weakness.  Denies having any nausea, vomiting, abdominal pain, or leg swelling to his knowledge.  Patient normally can ambulate around the house without need of assistance and does not require oxygen.  However, over the last couple days he had difficulty standing up and moving around for which he was using 2 canes and a rolling chair at home.  He had to call her people to help get up 1 instance from the chair.  Normally he only used a cane to get around if outside of the house for prolonged period of time or uneven ground.  He reports having significant arthritis in both knees for which he sometimes uses braces.  Last night he had difficulty taking a deep breath in and became anxious for which EMS was called.  Denies any significant history of tobacco use or daily alcohol use.  In route with EMS patient had been found to have O2 saturations at 88% on room air with improvement on 6 L nasal cannula oxygen.  Patient  had been given 1 DuoNeb prior to arrival.  In the emergency department patient was noted to be febrile up to 102.9 F with tachycardia, tachypnea, and O2 saturation currently maintained on 2 L nasal cannula oxygen.  Labs significant for WBC 3.9, hemoglobin 12.7, potassium 3, BUN 24, creatinine 1.2, calcium 8.6, AST 165, and ALT 64.  Influenza a screen positive.  Chest x-ray showed no acute abnormality. Patient had been given 500 mL normal saline IV fluids, acetaminophen at 1000 mg p.o., and started on Tamiflu.  Review of Systems: As mentioned in the history of present illness. All other systems reviewed and are negative. 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 No pertinent surgical history. Social History:  reports that he has never smoked. He has never used smokeless tobacco. He reports current alcohol use. He reports that he does not use drugs.  Allergies  Allergen  Reactions   Ibuprofen Rash    High dosages causes rash    Family History  Problem Relation Age of Onset   Hypertension Mother    Breast cancer Mother    Hypertension Father    CVA Father     Prior to Admission medications   Medication Sig Start Date End Date Taking? Authorizing Provider  allopurinol (ZYLOPRIM) 300 MG tablet Take 150 mg by mouth daily.     [provider]  amLODipine (NORVASC) 5 MG tablet Take 1 tablet (5 mg total) by mouth daily. 05/17/21   Quintella Reichert, MD  aspirin 81 MG tablet Take 81 mg by mouth daily.    [provider]  chlorthalidone (HYGROTON) 50  MG tablet Take 1 tablet by mouth once daily 05/17/21   Quintella Reichert, MD  doxazosin (CARDURA) 2 MG tablet Take 1 tablet (2 mg total) by mouth at bedtime. 03/01/21   Quintella Reichert, MD  losartan (COZAAR) 100 MG tablet Take 1 tablet (100 mg total) by mouth daily. 03/01/21   Quintella Reichert, MD  metoprolol succinate (TOPROL-XL) 25 MG 24 hr tablet Take 1 tablet by mouth once daily 05/17/21   Quintella Reichert, MD  naproxen (NAPROSYN) 500 MG tablet Take 500 mg by mouth as needed. As  Needed for joint pain with food    [provider]  rosuvastatin (CRESTOR) 5 MG tablet Take 1 tablet by mouth once daily 02/13/22   Quintella Reichert, MD    Physical Exam: Vitals:   04/29/22 0645 04/29/22 0800 04/29/22 0809 04/29/22 0830  BP: 130/67 126/60    Pulse: 87 90  91  Resp: 18 14  20   Temp:   100.2 F (37.9 C)   TempSrc:   Oral   SpO2: 95% 98%  97%  Weight:      Height:       Exam  Constitutional: Elderly male currently in no acute distress Eyes: PERRL, lids and conjunctivae normal ENMT: Mucous membranes are moist.  Fair dentition Neck: normal, supple.  No JVD appreciated. Respiratory: Overall aeration without significant wheezes or rhonchi appreciated.  O2 saturation currently maintained on 3.5 L of nasal cannula oxygen. Cardiovascular: Regular rate and rhythm, no murmurs / rubs / gallops.  Trace lower extremity edema.    Abdomen: no tenderness, no masses palpated.  Bowel sounds positive.  Musculoskeletal: No clubbing appreciated.  Decreased range of motion of the lower extremities Skin: no rashes, lesions, ulcers. No induration Neurologic: CN 2-12 grossly intact.  Patient unable to lift lower extremities off the bed without using his hands. Psychiatric: Normal judgment and insight. Alert and oriented x 3. Normal mood.   Data Reviewed:  EKG reveals sinus tachycardia 111 bpm with PVCs.  Assessment and Plan:  Sepsis  Acute respiratory failure with hypoxia due to influenza A On admission  patient was noted to be febrile up to 102.9 F with tachycardia, tachypnea, and WBC 3.9 meeting SIRS criteria.  O2 saturations noted to be 88% on room air with improvement on 6 L of nasal cannula oxygen.  Chest x-ray was noted no acute abnormality influenza a screening positive.  Patient had been given 500 mL normal saline IV fluids, acetaminophen at 1000 mg p.o., and started on Tamiflu.  No initial lactic acid hadbeen obtained. -Admit to a progressive bed -Continuous pulse oximetry with nasal cannula oxygen maintain O2 saturation greater than 92% -Incentive spirometry and flutter valve -Check single blood culture and lactic acid -Check procalcitonin -Check  D-dimer -Continue Tamiflu -Mucinex -Albuterol nebs as needed for shortness of breath/wheezing  Hypokalemia Hypocalcemia Acute.  Potassium 3 and calcium 8.6 with ionized calcium 1.08 on admission.  Patient on chlorthalidone as well as reported having some loose stools. -Give potassium chloride 40 meq p.o. -Give calcium gluconate 1 g IV -Continue to monitor and replace as needed  Renal insufficiency Acute.  On admission creatinine 1.2 with BUN 24.  Baseline creatinine previously noted around 0.9-1.1.  The BUN to creatinine ratio is 20 to suggest patient likely dehydrated related to influenza A.  Patient has been given 500 mL of normal saline IV fluids. -Check urinalysis -Avoid nephrotoxic agents -Continue normal saline IV fluids at 75 mL/h for total of 7 hours -Recheck kidney function in a.m.  Diastolic congestive heart failure Chronic.  Patient does not appear grossly fluid overloaded at this time.  BNP was 56 and chest x-ray did not show any signs for edema with mild cardiomegaly.  Last EF noted to be 50-55% with grade 2 diastolic dysfunction back in 06/2019. -Strict I&O's and daily weights  CAD Patient denies any complaints of chest pain.  Coronary CTA was done in January 2022 for atypical symptoms showing a coronary calcium score  323. There was mild CAD of the RCA and proximal and mid LAD with 25 -49% proximal RCA stenosis and proximal LAD stenosis with normal FFR.  He has a follow-up appointment with Dr. Mayford Knife on -Continue aspirin and plan to resume Crestor tomorrow as long as liver function studies remain stable -Keep outpatient follow-up with Dr. Mayford Knife  Essential hypertension On admission blood pressures noted to be maintained. -Continue amlodipine, metoprolol, doxazosin, and losartan -Held chlorthalidone due to AKI.  Resume when medically appropriate  Transaminitis Acute.  Labs revealed AST 165 and ALT 64.  Patient denies drinking on a daily basis.   -Recheck LFTs in a.m.  Hyperlipidemia -Resume Crestor tomorrow as long as LFTs stable not continue to rise  Gout, without acute flare Patient no longer on allopurinol and reports taking tart cherry extract.  Denies any symptoms of acute gout flare at this time. -Continue tart cherry extract in the outpatient setting  AAA Patient has a ascending aortic aneurysm last noted to be 4.4 cm by CT scan of the chest from 12/19/2021. -Continue annual screening surveillance  Morbid obesity BMI 40.01 kg/m  DVT prophylaxis: Lovenox Advance Care Planning:   Code Status: Full Code  Consults: None  Family Communication: Wife updated at bedside  Severity of Illness: The appropriate patient status for this patient is INPATIENT. Inpatient status is judged to be reasonable and necessary in order to provide the required intensity of service to ensure the patient's safety. The patient's presenting symptoms, physical exam findings, and initial radiographic and laboratory data in the context of their chronic comorbidities is felt to place them at high risk for further clinical deterioration. Furthermore, it is not anticipated that the patient will be medically stable for discharge from the hospital within 2 midnights of admission.   * I certify that at the point of admission  it is my clinical judgment that the patient will require inpatient hospital care spanning beyond 2 midnights from the point of admission due to high intensity of service, high risk for further deterioration and high frequency of surveillance required.*  Author: Clydie Braun, MD 04/29/2022 9:17 AM  For on call review [email or web address].

## 2022-04-29 NOTE — ED Triage Notes (Signed)
Pt bib [facility] from home c/o increased SOB, congestion and cough for 1 week that worsened tonight. Around son who was sick, pt tested negative for covid x2. Hx CHF and irregular rhythm, on metoprolol. SPO2 88% RA, 94% 6L [place]. Given 1 duoneb, 5 albuterol,  atrovent PTA.  HR 140 BP 148/80 SPO2 96% 6L

## 2022-04-29 NOTE — ED Provider Notes (Signed)
[facility] EMERGENCY DEPARTMENT Provider Note  CSN: [id number] Arrival date & time: 04/29/22 [id number]  Chief Complaint(s) Shortness of Breath  HPI Anthony Malone is a 71 y.o. male with a past medical history listed below including hypertension, hyperlipidemia here for 1 week of gradually worsening shortness of breath and chest congestion.  No associated chest pain. No N/V. Reports generalized fatigue. Symptoms became worse tonight prompting a call to EMS.  Patient reports that he tested negative for COVID twice in the last week.  Reports a history of heart failure but last EF in March 2022 was 50 to 55%.  Patient does have diastolic dysfunction.  In round with EMS he was given 1 DuoNeb which did not provide any significant relief.  The history is provided by the patient.    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 Authorizing Provider  allopurinol (ZYLOPRIM) 300 MG tablet Take 150 mg by mouth daily.     [provider]  amLODipine (NORVASC)  5 MG tablet Take 1 tablet (5 mg total) by mouth daily. 05/17/21   Sueanne Margarita, MD  aspirin 81 MG tablet Take 81 mg by mouth daily.    [provider]  chlorthalidone (HYGROTON) 50 MG tablet Take 1 tablet by mouth once daily 05/17/21   Sueanne Margarita, MD  doxazosin (CARDURA) 2 MG tablet Take 1 tablet (2 mg total) by mouth at bedtime. 03/01/21   Sueanne Margarita, MD  losartan (COZAAR) 100 MG tablet Take 1 tablet (100 mg total) by mouth daily. 03/01/21   Sueanne Margarita, MD  metoprolol succinate (TOPROL-XL) 25 MG 24 hr tablet Take 1 tablet by mouth once daily 05/17/21   Sueanne Margarita, MD  naproxen (NAPROSYN) 500 MG tablet Take 500 mg by mouth as needed. As  Needed for joint pain with food    [provider]  rosuvastatin (CRESTOR) 5 MG tablet Take 1 tablet by mouth once daily 02/13/22   Sueanne Margarita, MD  Allergies Ibuprofen  Review of Systems Review of Systems As noted in HPI  Physical Exam Vital Signs  I have reviewed the triage vital signs BP 130/67   Pulse 87   Temp 98.8 F (37.1 C) (Oral)   Resp 18   Ht 6' (1.829 m)   Wt 133.8 kg   SpO2 95%   BMI 40.01 kg/m   Physical Exam Vitals reviewed.  Constitutional:      General: He is not in acute distress.    Appearance: He is well-developed. He is not diaphoretic.  HENT:     Head: Normocephalic and atraumatic.     Nose: Nose normal.  Eyes:     General: No scleral icterus.       Right eye: No discharge.        Left eye: No discharge.     Conjunctiva/sclera: Conjunctivae normal.     Pupils: Pupils are equal, round, and reactive to light.  Cardiovascular:     Rate and Rhythm: Normal rate and regular rhythm.     Heart sounds: No murmur heard.    No friction rub. No gallop.  Pulmonary:     Effort: Tachypnea and respiratory distress present.     Breath sounds: No stridor.  Examination of the right-lower field reveals rales. Examination of the left-lower field reveals rales. Rales (coarse) present. No wheezing.  Abdominal:     General: There is no distension.     Palpations: Abdomen is soft.     Tenderness: There is no abdominal tenderness.  Musculoskeletal:        General: No tenderness.     Cervical back: Normal range of motion and neck supple.     Right lower leg: 1+ Pitting Edema present.     Left lower leg: 1+ Pitting Edema present.  Skin:    General: Skin is warm and dry.     Findings: No erythema or rash.  Neurological:     Mental Status: He is alert and oriented to person, place, and time.     ED Results and Treatments Labs (all labs ordered are listed, but only abnormal results are displayed) Labs Reviewed  RESP PANEL BY RT-PCR (RSV, FLU A&B, COVID)  RVPGX2 - Abnormal; Notable for the following components:      Result Value   Influenza A by PCR POSITIVE (*)    All other components within normal limits  COMPREHENSIVE METABOLIC PANEL - Abnormal; Notable for the following components:   Potassium 3.0 (*)    CO2 21 (*)    Glucose, Bld 136 (*)    BUN 24 (*)    Calcium 8.6 (*)    Albumin 3.4 (*)    AST 165 (*)    ALT 64 (*)    Alkaline Phosphatase 33 (*)    Total Bilirubin 1.5 (*)    All other components within normal limits  CBC WITH DIFFERENTIAL/PLATELET - Abnormal; Notable for the following components:   WBC 3.9 (*)    Hemoglobin 12.7 (*)    HCT 38.7 (*)    All other components within normal limits  I-STAT VENOUS BLOOD GAS, ED - Abnormal; Notable for the following components:   pCO2, Ven 34.6 (*)    pO2, Ven 60 (*)    Potassium 3.0 (*)    Calcium, Ion 1.08 (*)    HCT 38.0 (*)    Hemoglobin 12.9 (*)    All other components within normal limits  BRAIN NATRIURETIC PEPTIDE  EKG  EKG  Interpretation  Date/Time:  Saturday April 29 2022 05:15:56 EST Ventricular Rate:  111 PR Interval:  142 QRS Duration: 121 QT Interval:  335 QTC Calculation: 456 R Axis:   252 Text Interpretation: Sinus tachycardia Multiform ventricular premature complexes Nonspecific IVCD with LAD Nonspecific T abnormalities, lateral leads Since last tracing rate slower Confirmed by Addison Lank [phone]) on 04/29/2022 5:26:58 AM       Radiology DG Chest Port 1 View  Result Date: 04/29/2022 CLINICAL DATA:  Shortness of breath, congestion and cough. EXAM: PORTABLE CHEST 1 VIEW COMPARISON:  Chest CT no contrast 12/19/2021. FINDINGS: There is mild cardiomegaly. No vascular congestion is seen. There is aortic tortuosity and atherosclerosis with stable mediastinum. The lungs are clear. No pleural effusion is seen. Thoracic spondylosis. Stable overall aeration. IMPRESSION: No acute chest findings. Aortic atherosclerosis and tortuosity. Electronically Signed   By: Telford Nab M.D.   On: 04/29/2022 05:04    Medications Ordered in ED Medications  sodium chloride 0.9 % bolus 500 mL (500 mLs Intravenous New Bag/Given 04/29/22 0512)    Followed by  0.9 %  sodium chloride infusion (has no administration in time range)  acetaminophen (TYLENOL) tablet 1,000 mg (1,000 mg Oral Given 04/29/22 0323)  oseltamivir (TAMIFLU) capsule 75 mg (75 mg Oral Given 04/29/22 0624)                                                                                                                                     Procedures .1-3 Lead EKG Interpretation  Performed by: Fatima Blank, MD Authorized by: Fatima Blank, MD     Interpretation: abnormal     ECG rate:  140   ECG rate assessment: tachycardic     Rhythm: sinus rhythm     Ectopy: PVCs     Conduction: normal     (including critical care time)  Medical Decision Making / ED Course   Medical Decision Making Amount and/or Complexity of Data  Reviewed External Data Reviewed: radiology.    Details: ECHO noted above  Labs: ordered. Decision-making details documented in ED Course. Radiology: ordered and independent interpretation performed. Decision-making details documented in ED Course. ECG/medicine tests: ordered and independent interpretation performed. Decision-making details documented in ED Course.  Risk OTC drugs. Prescription drug management. Decision regarding hospitalization.   Patient presents in respiratory distress. Differential includes pneumonia (bacterial versus viral), pneumothorax, CHF exacerbation. Lower concern for pulmonary embolism.  Noted to be febrile to 102.9 favoring infection. Tylenol ordered.  EKG with sinus tachycardia and frequent PVCs.  CBC w/o leukocytosis or anemia CMP with mild hypoK. No electrolyte derangements. Mild transaminitis +flu A BNP wnl  Cxr w/o pna, pleural effusions or pulm edema.  Provided with IVF. Given Tamiflu  HR improved. Now only requiring [facility]      Final Clinical Impression(s) / ED Diagnoses Final diagnoses:  Respiratory distress  Influenza A  This chart was dictated using voice recognition software.  Despite best efforts to proofread,  errors can occur which can change the documentation meaning.    Nira Conn, MD 04/29/22 [phone]

## 2022-04-29 NOTE — ED Notes (Signed)
THIS IS 6E RIGHT  WE HAD A REJECTION THEN THIS ROOM POPPED UP

## 2022-04-29 NOTE — ED Triage Notes (Signed)
REPORT GIVEN TO MARY RN [facility] ACCEPTED THE PT

## 2022-04-29 NOTE — ED Notes (Signed)
Patient awake and alert this morning, no s/s of distress, wife present at bedside, will continue to monitor.

## 2022-04-30 DIAGNOSIS — J9601 Acute respiratory failure with hypoxia: Secondary | ICD-10-CM | POA: Diagnosis not present

## 2022-04-30 LAB — URINALYSIS, ROUTINE W REFLEX MICROSCOPIC
Bacteria, UA: NONE SEEN
Bilirubin Urine: NEGATIVE
Glucose, UA: NEGATIVE mg/dL
Ketones, ur: NEGATIVE mg/dL
Leukocytes,Ua: NEGATIVE
Nitrite: NEGATIVE
Protein, ur: 30 mg/dL — AB
Specific Gravity, Urine: 1.018 (ref 1.005–1.030)
pH: 5 (ref 5.0–8.0)

## 2022-04-30 LAB — BASIC METABOLIC PANEL
Anion gap: 6 (ref 5–15)
BUN: 23 mg/dL (ref 8–23)
CO2: 24 mmol/L (ref 22–32)
Calcium: 8.2 mg/dL — ABNORMAL LOW (ref 8.9–10.3)
Chloride: 105 mmol/L (ref 98–111)
Creatinine, Ser: 1.12 mg/dL (ref 0.61–1.24)
GFR, Estimated: >60 mL/min (ref >60)
Glucose, Bld: 99 mg/dL (ref 70–99)
Potassium: 3.1 mmol/L — ABNORMAL LOW (ref 3.5–5.1)
Sodium: 135 mmol/L (ref 135–145)

## 2022-04-30 LAB — CBC
HCT: 32.8 % — ABNORMAL LOW (ref 39.0–52.0)
Hemoglobin: 10.8 g/dL — ABNORMAL LOW (ref 13.0–17.0)
MCH: 28.2 pg (ref 26.0–34.0)
MCHC: 32.9 g/dL (ref 30.0–36.0)
MCV: 85.6 fL (ref 80.0–100.0)
Platelets: 84 10*3/uL — ABNORMAL LOW (ref 150–400)
RBC: 3.83 MIL/uL — ABNORMAL LOW (ref 4.22–5.81)
RDW: 13.7 % (ref 11.5–15.5)
WBC: 2.8 10*3/uL — ABNORMAL LOW (ref 4.0–10.5)
nRBC: 0 % (ref 0.0–0.2)

## 2022-04-30 LAB — PROCALCITONIN: Procalcitonin: 3.75 ng/mL

## 2022-04-30 MED ORDER — POTASSIUM CHLORIDE CRYS ER 20 MEQ PO TBCR
40.0000 meq | EXTENDED_RELEASE_TABLET | Freq: Two times a day (BID) | ORAL | Status: AC
  Administered 2022-04-30 (×2): 40 meq via ORAL
  Filled 2022-04-30 (×2): qty 2

## 2022-04-30 MED ORDER — SODIUM CHLORIDE 0.9 % IV SOLN
500.0000 mg | INTRAVENOUS | Status: DC
  Administered 2022-04-30 – 2022-05-01 (×2): 500 mg via INTRAVENOUS
  Filled 2022-04-30 (×2): qty 5

## 2022-04-30 MED ORDER — SODIUM CHLORIDE 0.9 % IV SOLN
1.0000 g | INTRAVENOUS | Status: DC
  Administered 2022-04-30 – 2022-05-02 (×3): 1 g via INTRAVENOUS
  Filled 2022-04-30 (×3): qty 10

## 2022-04-30 NOTE — Evaluation (Signed)
Physical Therapy Evaluation Patient Details Name: Anthony Malone MRN: [id number] DOB: Jul 08, 1950 Today's Date: 04/30/2022  History of Present Illness  71 y.o. male presents to [facility] hospital on 04/29/2022 with SOB and cough. Pt febrile in ED, found to be positive for flu A. PMH includes dilation of ascending aorta, gout, HTN, RBBB, CAD, pulmonary nodule.  Clinical Impression  Pt presents to PT with deficits in strength, power, endurance, gait, balance. Pt requires assistance to power up into standing due to LE weakness and power deficits. Once standing pt ambulates with support of RW, no LOB or knee buckling noted. Pt does report increased work of breathing and DOE when mobilizing on room air, desaturating to 89% with activity. PT encourages frequent mobility and sit to stand training during the day in an effort to improve LE strength. PT recommends discharge home when medically ready, pt declining post-acute PT services.       Recommendations for follow up therapy are one component of a multi-disciplinary discharge planning process, led by the attending physician.  Recommendations may be updated based on patient status, additional functional criteria and insurance authorization.  Follow Up Recommendations  (pt declines need for PT services post-acute)      Assistance Recommended at Discharge Intermittent Supervision/Assistance  Patient can return home with the following  A little help with walking and/or transfers;A little help with bathing/dressing/bathroom;Assistance with cooking/housework;Assist for transportation;Help with stairs or ramp for entrance    Equipment Recommendations  (pt reports he has access to a RW)  Recommendations for Other Services       Functional Status Assessment Patient has had a recent decline in their functional status and demonstrates the ability to make significant improvements in function in a reasonable and predictable amount of time.     Precautions /  Restrictions Precautions Precautions: Fall Precaution Comments: L knee OA, reports a history of intermittent buckling. PT wears knee sleeve or brace when knee is not doing well in an effort to improve stability Restrictions Weight Bearing Restrictions: No      Mobility  Bed Mobility                    Transfers Overall transfer level: Needs assistance Equipment used: Rolling walker (2 wheels) Transfers: Sit to/from Stand Sit to Stand: Min assist           General transfer comment: assist to power up into standing    Ambulation/Gait Ambulation/Gait assistance: Supervision Gait Distance (Feet): 150 Feet Assistive device: Rolling walker (2 wheels) Gait Pattern/deviations: Step-through pattern Gait velocity: reduced Gait velocity interpretation: <1.8 ft/sec, indicate of risk for recurrent falls   General Gait Details: slowed step-through gait  Stairs            Wheelchair Mobility    Modified Rankin (Stroke Patients Only)       Balance Overall balance assessment: Needs assistance Sitting-balance support: No upper extremity supported, Feet supported Sitting balance-Leahy Scale: Good     Standing balance support: Single extremity supported, Reliant on assistive device for balance Standing balance-Leahy Scale: Poor                               Pertinent Vitals/Pain Pain Assessment Pain Assessment: Faces Faces Pain Scale: Hurts little more Pain Location: knees Pain Descriptors / Indicators: Aching Pain Intervention(s): Monitored during session    Home Living Family/patient expects to be discharged to:: Private residence Living Arrangements: Spouse/significant other Available  Help at Discharge: Family;Available 24 hours/day Type of Home: House Home Access: Stairs to enter Entrance Stairs-Rails: Can reach both Entrance Stairs-Number of Steps: 4   Home Layout: One level;Laundry or work area in basement;Able to live on main level  with bedroom/bathroom Home Equipment: Cane - single Librarian, academic (2 wheels) (walking stick)      Prior Function Prior Level of Function : Independent/Modified Independent;Driving             Mobility Comments: pt utilizes a cane or walking stick when walking longer distances at the car shows that he attends       Hand Dominance        Extremity/Trunk Assessment   Upper Extremity Assessment Upper Extremity Assessment: Overall WFL for tasks assessed    Lower Extremity Assessment Lower Extremity Assessment: Generalized weakness (grossly 4+/5, chronic knee OA bilaterally)    Cervical / Trunk Assessment Cervical / Trunk Assessment: Normal  Communication   Communication: No difficulties  Cognition Arousal/Alertness: Awake/alert Behavior During Therapy: WFL for tasks assessed/performed Overall Cognitive Status: Within Functional Limits for tasks assessed                                          General Comments General comments (skin integrity, edema, etc.): pt on 2L [place] upon PT arrival, pt desats to 89% at end of ambulation when mobilizing on room air, does demonstarate increased RR and work of breathing. Pt recovers to 92% prior to replacing nasal canula with 2L [place] for comfort.    Exercises     Assessment/Plan    PT Assessment Patient needs continued PT services  PT Problem List Decreased strength;Decreased activity tolerance;Decreased balance;Decreased mobility;Decreased knowledge of use of DME       PT Treatment Interventions DME instruction;Gait training;Stair training;Functional mobility training;Therapeutic activities;Balance training;Patient/family education;Therapeutic exercise    PT Goals (Current goals can be found in the Care Plan section)  Acute Rehab PT Goals Patient Stated Goal: to return to independence PT Goal Formulation: With patient Time For Goal Achievement: 05/14/22 Potential to Achieve Goals: Good    Frequency Min  3X/week     Co-evaluation               AM-PAC PT "6 Clicks" Mobility  Outcome Measure Help needed turning from your back to your side while in a flat bed without using bedrails?: A Little Help needed moving from lying on your back to sitting on the side of a flat bed without using bedrails?: A Little Help needed moving to and from a bed to a chair (including a wheelchair)?: A Little Help needed standing up from a chair using your arms (e.g., wheelchair or bedside chair)?: A Little Help needed to walk in hospital room?: A Little Help needed climbing 3-5 steps with a railing? : A Little 6 Click Score: 18    End of Session Equipment Utilized During Treatment: Oxygen Activity Tolerance: Patient tolerated treatment well Patient left: in chair;with call bell/phone within reach;with family/visitor present Nurse Communication: Mobility status PT Visit Diagnosis: Other abnormalities of gait and mobility (R26.89);Muscle weakness (generalized) (M62.81)    Time: 1610-9604 PT Time Calculation (min) (ACUTE ONLY): 27 min   Charges:   PT Evaluation $PT Eval Low Complexity: 1 Low          Arlyss Gandy, PT, DPT Acute Rehabilitation Office [phone]   Arlyss Gandy 04/30/2022, 9:32  AM

## 2022-04-30 NOTE — Progress Notes (Signed)
PROGRESS NOTE    Anthony Malone  JYN:[id number] DOB: [birth year] DOA: 04/29/2022 PCP: Darrow Bussing, MD   Brief Narrative:    Anthony Malone is a 71 y.o. male with medical history significant of hypertension, hyperlipidemia, CAD, CHF, AAA, and obesity who presents with complaints of shortness of breath and cough over the last week.  Patient was admitted with acute hypoxemic respiratory failure due to influenza A and was also noted to meet SIRS criteria.  He has been started on Tamiflu.  He also is noted to have elevated procalcitonin levels and has been started on azithromycin and Rocephin 12/31.  He is showing some signs of improvement, but remains on 2 L nasal cannula this morning with significant sputum output.  D-dimer elevated and plan will be to check venous Dopplers to rule out DVT, no suspicion of PE.  Assessment & Plan:   Principal Problem:   Acute respiratory failure with hypoxia (HCC) Active Problems:   Sepsis (HCC)   Influenza A   Hypokalemia   Hypocalcemia   Renal insufficiency   Chronic diastolic CHF (congestive heart failure) (HCC)   CAD (coronary artery disease)   Essential hypertension, benign   Transaminitis   Hyperlipidemia   Gout   Acquired dilation of ascending aorta and aortic root (HCC)   Obesity, Class III, BMI 40-49.9 (morbid obesity) (HCC)  Assessment and Plan:   Sepsis  Acute respiratory failure with hypoxia due to influenza A On admission patient was noted to be febrile up to 102.9 F with tachycardia, tachypnea, and WBC 3.9 meeting SIRS criteria.  O2 saturations noted to be 88% on room air with improvement on 6 L of nasal cannula oxygen.  Chest x-ray was noted no acute abnormality influenza a screening positive.  Patient had been given 500 mL normal saline IV fluids, acetaminophen at 1000 mg p.o., and started on Tamiflu.  No initial lactic acid hadbeen obtained. -Admit to a progressive bed -Continuous pulse oximetry with nasal cannula oxygen  maintain O2 saturation greater than 92% -Incentive spirometry and flutter valve -Check single blood culture and lactic acid -Procalcitonin elevated, start Rocephin and azithromycin 12/31, check sputum cultures -D-dimer elevated, but no suspicion of PE given that oxygen levels are being weaned, and patient denies any chest pain, hemoptysis, or lower extremity edema, will check for DVT -Continue Tamiflu -Mucinex -Albuterol nebs as needed for shortness of breath/wheezing -Plan to wean to room air and may be stable for discharge in the next 24-48 hours   Hypokalemia Hypocalcemia Acute.  Potassium 3 and calcium 8.6 with ionized calcium 1.08 on admission.  Patient on chlorthalidone as well as reported having some loose stools. -Given calcium gluconate -Plan to give further potassium supplementation today and monitor in a.m.   Renal insufficiency Acute.  On admission creatinine 1.2 with BUN 24.  Baseline creatinine previously noted around 0.9-1.1.  The BUN to creatinine ratio is 20 to suggest patient likely dehydrated related to influenza A.  Patient has been given 500 mL of normal saline IV fluids. -Check urinalysis -Avoid nephrotoxic agents -Recheck in am. Improved with IVF   Diastolic congestive heart failure Chronic.  Patient does not appear grossly fluid overloaded at this time.  BNP was 56 and chest x-ray did not show any signs for edema with mild cardiomegaly.  Last EF noted to be 50-55% with grade 2 diastolic dysfunction back in 06/2019. -Strict I&O's and daily weights   CAD Patient denies any complaints of chest pain.  Coronary CTA was done in  January 2022 for atypical symptoms showing a coronary calcium score 323. There was mild CAD of the RCA and proximal and mid LAD with 25 -49% proximal RCA stenosis and proximal LAD stenosis with normal FFR.  He has a follow-up appointment with Dr. Radford Pax on -Continue aspirin and plan to resume Crestor tomorrow as long as liver function studies  remain stable -Keep outpatient follow-up with Dr. Radford Pax   Essential hypertension On admission blood pressures noted to be maintained. -Continue amlodipine, metoprolol, doxazosin, and losartan -Held chlorthalidone due to AKI.  Resume when medically appropriate likely in am   Transaminitis Acute.  Labs revealed AST 165 and ALT 64.  Patient denies drinking on a daily basis.   -Recheck LFTs in a.m.   Hyperlipidemia -Resume Crestor tomorrow as long as LFTs stable not continue to rise   Gout, without acute flare Patient no longer on allopurinol and reports taking tart cherry extract.  Denies any symptoms of acute gout flare at this time. -Continue tart cherry extract in the outpatient setting   AAA Patient has a ascending aortic aneurysm last noted to be 4.4 cm by CT scan of the chest from 12/19/2021. -Continue annual screening surveillance   Morbid obesity BMI 40.01 kg/m    DVT prophylaxis: Lovenox Code Status: Full Family Communication: Wife at bedside 12/31 Disposition Plan:  Status is: Inpatient Remains inpatient appropriate because: Need for IV medications.   Consultants:  None  Procedures:  None  Antimicrobials:  Anti-infectives (From admission, onward)    Start     Dose/Rate Route Frequency Ordered Stop   04/29/22 2200  oseltamivir (TAMIFLU) capsule 75 mg        75 mg Oral 2 times daily 04/29/22 1002     04/29/22 0530  oseltamivir (TAMIFLU) capsule 75 mg        75 mg Oral  Once 04/29/22 0518 04/29/22 [id number]       Subjective: Patient seen and evaluated today with ongoing significant sputum production/cough.  States that he is starting to feel much better and is being weaned down his oxygen requirement.  Has not ambulated much yet.  Objective: Vitals:   04/29/22 1713 04/29/22 2112 04/30/22 0039 04/30/22 0639  BP: 131/78 131/60 134/60 (!) 146/78  Pulse: 93 86 80 82  Resp: 20 20 20 19   Temp: 98.4 F (36.9 C) 99.3 F (37.4 C) 99.3 F (37.4 C) 99.1 F (37.3  C)  TempSrc: Oral Oral Oral Oral  SpO2: 95% 95% 96% 97%  Weight:    132.1 kg  Height:        Intake/Output Summary (Last 24 hours) at 04/30/2022 0758 Last data filed at 04/30/2022 [id number] Gross per 24 hour  Intake 610.83 ml  Output 1050 ml  Net -439.17 ml   Filed Weights   04/29/22 0518 04/30/22 0639  Weight: 133.8 kg 132.1 kg    Examination:  General exam: Appears calm and comfortable  Respiratory system: Clear to auscultation. Respiratory effort normal.  2 L nasal cannula Cardiovascular system: S1 & S2 heard, RRR.  Gastrointestinal system: Abdomen is soft Central nervous system: Alert and awake Extremities: No edema Skin: No significant lesions noted Psychiatry: Flat affect.    Data Reviewed: I have personally reviewed following labs and imaging studies  CBC: Recent Labs  Lab 04/29/22 0313 04/29/22 0335 04/30/22 0127  WBC 3.9*  --  2.8*  NEUTROABS 2.3  --   --   HGB 12.7* 12.9* 10.8*  HCT 38.7* 38.0* 32.8*  MCV 86.0  --  85.6  PLT PLATELET CLUMPS NOTED ON SMEAR, COUNT APPEARS ADEQUATE  --  84*   Basic Metabolic Panel: Recent Labs  Lab 04/29/22 0313 04/29/22 0335 04/30/22 0127  NA 136 136 135  K 3.0* 3.0* 3.1*  CL 100  --  105  CO2 21*  --  24  GLUCOSE 136*  --  99  BUN 24*  --  23  CREATININE 1.20  --  1.12  CALCIUM 8.6*  --  8.2*   GFR: Estimated Creatinine Clearance: 82.6 mL/min (by C-G formula based on SCr of 1.12 mg/dL). Liver Function Tests: Recent Labs  Lab 04/29/22 0313  AST 165*  ALT 64*  ALKPHOS 33*  BILITOT 1.5*  PROT 6.5  ALBUMIN 3.4*   No results for input(s): "LIPASE", "AMYLASE" in the last 168 hours. No results for input(s): "AMMONIA" in the last 168 hours. Coagulation Profile: No results for input(s): "INR", "PROTIME" in the last 168 hours. Cardiac Enzymes: No results for input(s): "CKTOTAL", "CKMB", "CKMBINDEX", "TROPONINI" in the last 168 hours. BNP (last 3 results) No results for input(s): "PROBNP" in the last 8760  hours. HbA1C: No results for input(s): "HGBA1C" in the last 72 hours. CBG: No results for input(s): "GLUCAP" in the last 168 hours. Lipid Profile: No results for input(s): "CHOL", "HDL", "LDLCALC", "TRIG", "CHOLHDL", "LDLDIRECT" in the last 72 hours. Thyroid Function Tests: No results for input(s): "TSH", "T4TOTAL", "FREET4", "T3FREE", "THYROIDAB" in the last 72 hours. Anemia Panel: No results for input(s): "VITAMINB12", "FOLATE", "FERRITIN", "TIBC", "IRON", "RETICCTPCT" in the last 72 hours. Sepsis Labs: Recent Labs  Lab 04/29/22 [id number] 04/29/22 0925 04/29/22 1736 04/30/22 0127  PROCALCITON  --  2.49  --  3.75  LATICACIDVEN 1.8  --  1.3  --     Recent Results (from the past 240 hour(s))  Resp panel by RT-PCR (RSV, Flu A&B, Covid) Anterior Nasal Swab     Status: Abnormal   Collection Time: 04/29/22  3:10 AM   Specimen: Anterior Nasal Swab  Result Value Ref Range Status   SARS Coronavirus 2 by RT PCR NEGATIVE NEGATIVE Final    Comment: (NOTE) SARS-CoV-2 target nucleic acids are NOT DETECTED.  The SARS-CoV-2 RNA is generally detectable in upper respiratory specimens during the acute phase of infection. The lowest concentration of SARS-CoV-2 viral copies this assay can detect is 138 copies/mL. A negative result does not preclude SARS-Cov-2 infection and should not be used as the sole basis for treatment or other patient management decisions. A negative result may occur with  improper specimen collection/handling, submission of specimen other than nasopharyngeal swab, presence of viral mutation(s) within the areas targeted by this assay, and inadequate number of viral copies(<138 copies/mL). A negative result must be combined with clinical observations, patient history, and epidemiological information. The expected result is Negative.  Fact Sheet for Patients:  EntrepreneurPulse.com.au  Fact Sheet for Healthcare Providers:   IncredibleEmployment.be  This test is no t yet approved or cleared by the [place] FDA and  has been authorized for detection and/or diagnosis of SARS-CoV-2 by FDA under an Emergency Use Authorization (EUA). This EUA will remain  in effect (meaning this test can be used) for the duration of the COVID-19 declaration under Section 564(b)(1) of the Act, 21 U.S.C.section 360bbb-3(b)(1), unless the authorization is terminated  or revoked sooner.       Influenza A by PCR POSITIVE (A) NEGATIVE Final   Influenza B by PCR NEGATIVE NEGATIVE Final    Comment: (NOTE) The Xpert Xpress SARS-CoV-2/FLU/RSV plus  assay is intended as an aid in the diagnosis of influenza from Nasopharyngeal swab specimens and should not be used as a sole basis for treatment. Nasal washings and aspirates are unacceptable for Xpert Xpress SARS-CoV-2/FLU/RSV testing.  Fact Sheet for Patients: EntrepreneurPulse.com.au  Fact Sheet for Healthcare Providers: IncredibleEmployment.be  This test is not yet approved or cleared by the [place] FDA and has been authorized for detection and/or diagnosis of SARS-CoV-2 by FDA under an Emergency Use Authorization (EUA). This EUA will remain in effect (meaning this test can be used) for the duration of the COVID-19 declaration under Section 564(b)(1) of the Act, 21 U.S.C. section 360bbb-3(b)(1), unless the authorization is terminated or revoked.     Resp Syncytial Virus by PCR NEGATIVE NEGATIVE Final    Comment: (NOTE) Fact Sheet for Patients: EntrepreneurPulse.com.au  Fact Sheet for Healthcare Providers: IncredibleEmployment.be  This test is not yet approved or cleared by the [place] FDA and has been authorized for detection and/or diagnosis of SARS-CoV-2 by FDA under an Emergency Use Authorization (EUA). This EUA will remain in effect (meaning this test can be used)  for the duration of the COVID-19 declaration under Section 564(b)(1) of the Act, 21 U.S.C. section 360bbb-3(b)(1), unless the authorization is terminated or revoked.  Performed at [facility], [place] [address] [place], [place] [address]   Culture, blood (single) w Reflex to ID Panel     Status: None (Preliminary result)   Collection Time: 04/29/22 10:49 AM   Specimen: BLOOD  Result Value Ref Range Status   Specimen Description BLOOD LEFT ANTECUBITAL  Final   Special Requests   Final    BOTTLES DRAWN AEROBIC AND ANAEROBIC Blood Culture adequate volume   Culture   Final    NO GROWTH < 24 HOURS Performed at [facility], [place] [address], [place], [place] [address]    Report Status PENDING  Incomplete         Radiology Studies: DG Chest Port 1 View  Result Date: 04/29/2022 CLINICAL DATA:  Shortness of breath, congestion and cough. EXAM: PORTABLE CHEST 1 VIEW COMPARISON:  Chest CT no contrast 12/19/2021. FINDINGS: There is mild cardiomegaly. No vascular congestion is seen. There is aortic tortuosity and atherosclerosis with stable mediastinum. The lungs are clear. No pleural effusion is seen. Thoracic spondylosis. Stable overall aeration. IMPRESSION: No acute chest findings. Aortic atherosclerosis and tortuosity. Electronically Signed   By: Telford Nab M.D.   On: 04/29/2022 05:04        Scheduled Meds:  amLODipine  5 mg Oral Daily   aspirin EC  81 mg Oral Daily   doxazosin  2 mg Oral Daily   enoxaparin (LOVENOX) injection  40 mg Subcutaneous Q24H   losartan  100 mg Oral Daily   metoprolol succinate  25 mg Oral QHS   oseltamivir  75 mg Oral BID   rosuvastatin  5 mg Oral QPM   sodium chloride flush  3 mL Intravenous Q12H     LOS: 1 day    Time spent: 35 minutes    Shaylynn Nulty Darleen Crocker, DO [facility]  If 7PM-7AM, please contact night-coverage [email or web address] 04/30/2022, 7:58 AM

## 2022-05-01 ENCOUNTER — Inpatient Hospital Stay (HOSPITAL_COMMUNITY): Payer: Medicare Other

## 2022-05-01 DIAGNOSIS — R7989 Other specified abnormal findings of blood chemistry: Secondary | ICD-10-CM | POA: Diagnosis not present

## 2022-05-01 DIAGNOSIS — J9601 Acute respiratory failure with hypoxia: Secondary | ICD-10-CM

## 2022-05-01 LAB — COMPREHENSIVE METABOLIC PANEL
ALT: 78 U/L — ABNORMAL HIGH (ref 0–44)
AST: 172 U/L — ABNORMAL HIGH (ref 15–41)
Albumin: 2.8 g/dL — ABNORMAL LOW (ref 3.5–5.0)
Alkaline Phosphatase: 33 U/L — ABNORMAL LOW (ref 38–126)
Anion gap: 9 (ref 5–15)
BUN: 23 mg/dL (ref 8–23)
CO2: 25 mmol/L (ref 22–32)
Calcium: 8.3 mg/dL — ABNORMAL LOW (ref 8.9–10.3)
Chloride: 104 mmol/L (ref 98–111)
Creatinine, Ser: 1.06 mg/dL (ref 0.61–1.24)
GFR, Estimated: >60 mL/min (ref >60)
Glucose, Bld: 111 mg/dL — ABNORMAL HIGH (ref 70–99)
Potassium: 3.4 mmol/L — ABNORMAL LOW (ref 3.5–5.1)
Sodium: 138 mmol/L (ref 135–145)
Total Bilirubin: 0.8 mg/dL (ref 0.3–1.2)
Total Protein: 5.7 g/dL — ABNORMAL LOW (ref 6.5–8.1)

## 2022-05-01 LAB — CBC
HCT: 32.5 % — ABNORMAL LOW (ref 39.0–52.0)
Hemoglobin: 11.2 g/dL — ABNORMAL LOW (ref 13.0–17.0)
MCH: 29.1 pg (ref 26.0–34.0)
MCHC: 34.5 g/dL (ref 30.0–36.0)
MCV: 84.4 fL (ref 80.0–100.0)
Platelets: 97 10*3/uL — ABNORMAL LOW (ref 150–400)
RBC: 3.85 MIL/uL — ABNORMAL LOW (ref 4.22–5.81)
RDW: 13.7 % (ref 11.5–15.5)
WBC: 2.3 10*3/uL — ABNORMAL LOW (ref 4.0–10.5)
nRBC: 0 % (ref 0.0–0.2)

## 2022-05-01 LAB — EXPECTORATED SPUTUM ASSESSMENT W GRAM STAIN, RFLX TO RESP C

## 2022-05-01 LAB — PROCALCITONIN: Procalcitonin: 2.18 ng/mL

## 2022-05-01 LAB — MAGNESIUM: Magnesium: 1.4 mg/dL — ABNORMAL LOW (ref 1.7–2.4)

## 2022-05-01 MED ORDER — MAGNESIUM SULFATE 2 GM/50ML IV SOLN
2.0000 g | Freq: Once | INTRAVENOUS | Status: AC
  Administered 2022-05-01: 2 g via INTRAVENOUS
  Filled 2022-05-01: qty 50

## 2022-05-01 MED ORDER — AZITHROMYCIN 250 MG PO TABS
500.0000 mg | ORAL_TABLET | Freq: Every day | ORAL | Status: DC
  Administered 2022-05-02: 500 mg via ORAL
  Filled 2022-05-01: qty 2

## 2022-05-01 MED ORDER — POTASSIUM CHLORIDE CRYS ER 20 MEQ PO TBCR
40.0000 meq | EXTENDED_RELEASE_TABLET | Freq: Once | ORAL | Status: AC
  Administered 2022-05-01: 40 meq via ORAL
  Filled 2022-05-01: qty 2

## 2022-05-01 NOTE — Progress Notes (Signed)
Pt still SHOB w/ any exertion, such as transiting from laying to sitting on edge of bed. Diffuse wheezing throughout. PRN neb administered

## 2022-05-01 NOTE — Progress Notes (Signed)
Physical Therapy Treatment Patient Details Name: Anthony Malone MRN: [id number] DOB: 10/30/1950 Today's Date: 05/01/2022   History of Present Illness 72 y.o. male presents to [facility] hospital on 04/29/2022 with SOB and cough. Pt febrile in ED, found to be positive for flu A. PMH includes dilation of ascending aorta, gout, HTN, RBBB, CAD, pulmonary nodule.    PT Comments    Pt's wife and daughter in room during session and eager to see pt progress. Pt requires increased effort to come to standing from low recliner, reporting increased pain and stiffness in knees from longstanding OA. Pt requires modA for power up to RW, once up able to ambulate to stairwell with minA in RW and ascend 1 step with use of bilateral handrails. Pt and daughter discuss discharge to home with only one step to enter. With daughter's encouragement, pt agreeable to HHPT, RW and BSC for discharge. Pt educated on need for mobilization hourly to decrease knee pain and stiffness that limits his mobility.     Recommendations for follow up therapy are one component of a multi-disciplinary discharge planning process, led by the attending physician.  Recommendations may be updated based on patient status, additional functional criteria and insurance authorization.  Follow Up Recommendations  Home health PT (daughter in room and encourages PT and equipment previously refused)     Assistance Recommended at Discharge Intermittent Supervision/Assistance  Patient can return home with the following A little help with walking and/or transfers;A little help with bathing/dressing/bathroom;Assistance with cooking/housework;Assist for transportation;Help with stairs or ramp for entrance   Equipment Recommendations  Rolling walker (2 wheels);BSC/3in1    Recommendations for Other Services       Precautions / Restrictions Precautions Precautions: Fall Precaution Comments: L knee OA, reports a history of intermittent buckling. PT wears knee  sleeve or brace when knee is not doing well in an effort to improve stability Restrictions Weight Bearing Restrictions: No     Mobility  Bed Mobility               General bed mobility comments: sitting up in recliner on entry    Transfers Overall transfer level: Needs assistance Equipment used: Rolling walker (2 wheels) Transfers: Sit to/from Stand Sit to Stand: Mod assist           General transfer comment: modA for power up and steadying with RW    Ambulation/Gait Ambulation/Gait assistance: Min assist, Min guard Gait Distance (Feet): 300 Feet Assistive device: Rolling walker (2 wheels) Gait Pattern/deviations: Step-through pattern, Knees buckling, Decreased step length - right, Decreased step length - left, Shuffle, Antalgic, Trunk flexed Gait velocity: reduced Gait velocity interpretation: <1.8 ft/sec, indicate of risk for recurrent falls   General Gait Details: slowed step-through gait, starts with stiffness in B knees which progresses to slight L>R knee buckling at end of ambulation due to increased pain   Stairs Stairs: Yes Stairs assistance: Min assist Stair Management: Two rails, Forwards, Backwards Number of Stairs: 1 General stair comments: pt to d/c to home with only one small step to enter until strength is regained       Balance Overall balance assessment: Needs assistance Sitting-balance support: No upper extremity supported, Feet supported Sitting balance-Leahy Scale: Good     Standing balance support: Single extremity supported, Reliant on assistive device for balance Standing balance-Leahy Scale: Poor  Cognition Arousal/Alertness: Awake/alert Behavior During Therapy: WFL for tasks assessed/performed Overall Cognitive Status: Within Functional Limits for tasks assessed                                             General Comments General comments (skin integrity, edema,  etc.): VSS on RA, daughter and wife present throughout session.      Pertinent Vitals/Pain Pain Assessment Pain Assessment: Faces Faces Pain Scale: Hurts even more Pain Location: knees Pain Descriptors / Indicators: Aching Pain Intervention(s): Limited activity within patient's tolerance, Monitored during session, Repositioned, Patient requesting pain meds-RN notified     PT Goals (current goals can now be found in the care plan section) Acute Rehab PT Goals Patient Stated Goal: to return to independence PT Goal Formulation: With patient Time For Goal Achievement: 05/14/22 Potential to Achieve Goals: Good Progress towards PT goals: Progressing toward goals    Frequency    Min 3X/week      PT Plan Discharge plan needs to be updated;Equipment recommendations need to be updated       AM-PAC PT "6 Clicks" Mobility   Outcome Measure  Help needed turning from your back to your side while in a flat bed without using bedrails?: A Little Help needed moving from lying on your back to sitting on the side of a flat bed without using bedrails?: A Little Help needed moving to and from a bed to a chair (including a wheelchair)?: A Little Help needed standing up from a chair using your arms (e.g., wheelchair or bedside chair)?: A Little Help needed to walk in hospital room?: A Little Help needed climbing 3-5 steps with a railing? : A Little 6 Click Score: 18    End of Session Equipment Utilized During Treatment: Gait belt Activity Tolerance: Patient tolerated treatment well;Patient limited by pain Patient left: in chair;with call bell/phone within reach;with family/visitor present Nurse Communication: Mobility status PT Visit Diagnosis: Other abnormalities of gait and mobility (R26.89);Muscle weakness (generalized) (M62.81)     Time: 4098-1191 PT Time Calculation (min) (ACUTE ONLY): 23 min  Charges:  $Gait Training: 8-22 mins $Therapeutic Activity: 8-22 mins                      Mannat Benedetti B. Migdalia Dk PT, DPT Acute Rehabilitation Services Please use secure chat or  Call Office [phone]    [place] 05/01/2022, 3:45 PM

## 2022-05-01 NOTE — Progress Notes (Signed)
Nurse requested Mobility Specialist to perform oxygen saturation test with pt which includes removing pt from oxygen both at rest and while ambulating.  Below are the results from that testing.     Patient Saturations on Room Air at Rest = spO2 90%  Patient Saturations on Room Air while Ambulating = sp02 93% .  Rested and performed pursed lip breathing for 1 minute with sp02 at n/a%.  Patient Saturations on 0 Liters of oxygen while Ambulating = sp02 100%  At end of testing pt left in room on 0  Liters of oxygen.  Reported results to nurse.

## 2022-05-01 NOTE — Progress Notes (Signed)
VASCULAR LAB    Bilateral lower extremity venous duplex has been performed.  See CV proc for preliminary results.   Ralynn San, RVT 05/01/2022, 9:56 AM

## 2022-05-01 NOTE — Progress Notes (Signed)
Mobility Specialist - Progress Note   05/01/22 1015  Mobility  Activity Ambulated with assistance in hallway  Level of Assistance Minimal assist, patient does 75% or more  Assistive Device Front wheel walker  Distance Ambulated (ft) 220 ft  Activity Response Tolerated well  Mobility Referral Yes  $Mobility charge 1 Mobility   Pt was received in chair and agreeable to session. No complaints throughout. Pt was returned to chair with all needs met.   Franki Monte  Mobility Specialist Please contact via Solicitor or Rehab office at [phone]

## 2022-05-01 NOTE — Progress Notes (Signed)
PROGRESS NOTE    Anthony Malone  JSH:[id number] DOB: [birth year] DOA: 04/29/2022 PCP: Darrow Bussing, MD   Brief Narrative:    Anthony Malone is a 72 y.o. male with medical history significant of hypertension, hyperlipidemia, CAD, CHF, AAA, and obesity who presents with complaints of shortness of breath and cough over the last week.  Patient was admitted with acute hypoxemic respiratory failure due to influenza A and was also noted to meet SIRS criteria.  He has been started on Tamiflu.  He also is noted to have elevated procalcitonin levels and has been started on azithromycin and Rocephin 12/31.  He is showing some signs of improvement, but remains on 2 L nasal cannula this morning with significant sputum output.  D-dimer elevated and plan will be to check venous Dopplers to rule out DVT, no suspicion of PE.  Assessment & Plan:   Principal Problem:   Acute respiratory failure with hypoxia (HCC) Active Problems:   Sepsis (HCC)   Influenza A   Hypokalemia   Hypocalcemia   Renal insufficiency   Chronic diastolic CHF (congestive heart failure) (HCC)   CAD (coronary artery disease)   Essential hypertension, benign   Transaminitis   Hyperlipidemia   Gout   Acquired dilation of ascending aorta and aortic root (HCC)   Obesity, Class III, BMI 40-49.9 (morbid obesity) (HCC)  Assessment and Plan:   Sepsis  Acute respiratory failure with hypoxia due to influenza A On admission patient was noted to be febrile up to 102.9 F with tachycardia, tachypnea, and WBC 3.9 meeting SIRS criteria.  O2 saturations noted to be 88% on room air with improvement on 6 L of nasal cannula oxygen.  Chest x-ray was noted no acute abnormality influenza a screening positive.  Patient had been given 500 mL normal saline IV fluids, acetaminophen at 1000 mg p.o., and started on Tamiflu.  No initial lactic acid hadbeen obtained. -D-dimer elevated, but no suspicion of PE given that oxygen levels are being weaned, and  patient denies any chest pain, hemoptysis, or lower extremity edema, will check for DVT -Continue Tamiflu -Mucinex -Albuterol nebs as needed for shortness of breath/wheezing -weaned to room air   Hypokalemia Hypocalcemia hypomagnesemia -replete   Renal insufficiency -resolved   Diastolic congestive heart failure Chronic.  Patient does not appear grossly fluid overloaded at this time.  BNP was 56 and chest x-ray did not show any signs for edema with mild cardiomegaly.  Last EF noted to be 50-55% with grade 2 diastolic dysfunction back in 06/2019. -Strict I&O's and daily weights   CAD Patient denies any complaints of chest pain.  Coronary CTA was done in January 2022 for atypical symptoms showing a coronary calcium score 323. There was mild CAD of the RCA and proximal and mid LAD with 25 -49% proximal RCA stenosis and proximal LAD stenosis with normal FFR.  He has a follow-up appointment with Dr. Mayford Knife     Essential hypertension On admission blood pressures noted to be maintained. -Continue amlodipine, metoprolol, doxazosin, and losartan -Held chlorthalidone due to AKI.   -resume upon d/c   Transaminitis Acute.  Labs revealed AST 165 and ALT 64.  Patient denies drinking on a daily basis.   -Recheck LFTs in a.m.   Hyperlipidemia -Resume Crestor outpateint   Gout, without acute flare Patient no longer on allopurinol and reports taking tart cherry extract.  Denies any symptoms of acute gout flare at this time. -Continue tart cherry extract in the outpatient setting  AAA Patient has a ascending aortic aneurysm last noted to be 4.4 cm by CT scan of the chest from 12/19/2021. -Continue annual screening surveillance   Morbid obesity BMI 40.01 kg/m    DVT prophylaxis: Lovenox Code Status: Full Disposition Plan: home in AM Status is: Inpatient Remains inpatient appropriate because: Need for IV medications.    Subjective: Almost back to normal, off O2  today  Objective: Vitals:   05/01/22 0545 05/01/22 0822 05/01/22 0954 05/01/22 1025  BP: 131/74 (!) 149/98    Pulse: 64 74    Resp: 18 20    Temp: 98.3 F (36.8 C) 98.4 F (36.9 C)    TempSrc: Oral Oral    SpO2: 95% 96% 97% 94%  Weight: 132 kg     Height:        Intake/Output Summary (Last 24 hours) at 05/01/2022 1211 Last data filed at 05/01/2022 1109 Gross per 24 hour  Intake 150.77 ml  Output --  Net 150.77 ml   Filed Weights   04/29/22 0518 04/30/22 0639 05/01/22 0545  Weight: 133.8 kg 132.1 kg 132 kg    Examination:  General exam: Appears calm and comfortable  Respiratory system: Clear to auscultation. Respiratory effort normal.  Cardiovascular system: S1 & S2 heard, RRR.  Gastrointestinal system: Abdomen is soft Central nervous system: Alert and awake Extremities: No edema Skin: No significant lesions noted Psychiatry: Flat affect.    Data Reviewed: I have personally reviewed following labs and imaging studies  CBC: Recent Labs  Lab 04/29/22 0313 04/29/22 0335 04/30/22 0127 05/01/22 0142  WBC 3.9*  --  2.8* 2.3*  NEUTROABS 2.3  --   --   --   HGB 12.7* 12.9* 10.8* 11.2*  HCT 38.7* 38.0* 32.8* 32.5*  MCV 86.0  --  85.6 84.4  PLT PLATELET CLUMPS NOTED ON SMEAR, COUNT APPEARS ADEQUATE  --  84* 97*   Basic Metabolic Panel: Recent Labs  Lab 04/29/22 0313 04/29/22 0335 04/30/22 0127 05/01/22 0142  NA 136 136 135 138  K 3.0* 3.0* 3.1* 3.4*  CL 100  --  105 104  CO2 21*  --  24 25  GLUCOSE 136*  --  99 111*  BUN 24*  --  23 23  CREATININE 1.20  --  1.12 1.06  CALCIUM 8.6*  --  8.2* 8.3*  MG  --   --   --  1.4*   GFR: Estimated Creatinine Clearance: 87.3 mL/min (by C-G formula based on SCr of 1.06 mg/dL). Liver Function Tests: Recent Labs  Lab 04/29/22 0313 05/01/22 0142  AST 165* 172*  ALT 64* 78*  ALKPHOS 33* 33*  BILITOT 1.5* 0.8  PROT 6.5 5.7*  ALBUMIN 3.4* 2.8*   No results for input(s): "LIPASE", "AMYLASE" in the last 168  hours. No results for input(s): "AMMONIA" in the last 168 hours. Coagulation Profile: No results for input(s): "INR", "PROTIME" in the last 168 hours. Cardiac Enzymes: No results for input(s): "CKTOTAL", "CKMB", "CKMBINDEX", "TROPONINI" in the last 168 hours. BNP (last 3 results) No results for input(s): "PROBNP" in the last 8760 hours. HbA1C: No results for input(s): "HGBA1C" in the last 72 hours. CBG: No results for input(s): "GLUCAP" in the last 168 hours. Lipid Profile: No results for input(s): "CHOL", "HDL", "LDLCALC", "TRIG", "CHOLHDL", "LDLDIRECT" in the last 72 hours. Thyroid Function Tests: No results for input(s): "TSH", "T4TOTAL", "FREET4", "T3FREE", "THYROIDAB" in the last 72 hours. Anemia Panel: No results for input(s): "VITAMINB12", "FOLATE", "FERRITIN", "TIBC", "IRON", "RETICCTPCT" in  the last 72 hours. Sepsis Labs: Recent Labs  Lab 04/29/22 2706 04/29/22 0925 04/29/22 1736 04/30/22 0127 05/01/22 0142  PROCALCITON  --  2.49  --  3.75 2.18  LATICACIDVEN 1.8  --  1.3  --   --     Recent Results (from the past 240 hour(s))  Resp panel by RT-PCR (RSV, Flu A&B, Covid) Anterior Nasal Swab     Status: Abnormal   Collection Time: 04/29/22  3:10 AM   Specimen: Anterior Nasal Swab  Result Value Ref Range Status   SARS Coronavirus 2 by RT PCR NEGATIVE NEGATIVE Final    Comment: (NOTE) SARS-CoV-2 target nucleic acids are NOT DETECTED.  The SARS-CoV-2 RNA is generally detectable in upper respiratory specimens during the acute phase of infection. The lowest concentration of SARS-CoV-2 viral copies this assay can detect is 138 copies/mL. A negative result does not preclude SARS-Cov-2 infection and should not be used as the sole basis for treatment or other patient management decisions. A negative result may occur with  improper specimen collection/handling, submission of specimen other than nasopharyngeal swab, presence of viral mutation(s) within the areas targeted by  this assay, and inadequate number of viral copies(<138 copies/mL). A negative result must be combined with clinical observations, patient history, and epidemiological information. The expected result is Negative.  Fact Sheet for Patients:  BloggerCourse.com  Fact Sheet for Healthcare Providers:  SeriousBroker.it  This test is no t yet approved or cleared by the [place] FDA and  has been authorized for detection and/or diagnosis of SARS-CoV-2 by FDA under an Emergency Use Authorization (EUA). This EUA will remain  in effect (meaning this test can be used) for the duration of the COVID-19 declaration under Section 564(b)(1) of the Act, 21 U.S.C.section 360bbb-3(b)(1), unless the authorization is terminated  or revoked sooner.       Influenza A by PCR POSITIVE (A) NEGATIVE Final   Influenza B by PCR NEGATIVE NEGATIVE Final    Comment: (NOTE) The Xpert Xpress SARS-CoV-2/FLU/RSV plus assay is intended as an aid in the diagnosis of influenza from Nasopharyngeal swab specimens and should not be used as a sole basis for treatment. Nasal washings and aspirates are unacceptable for Xpert Xpress SARS-CoV-2/FLU/RSV testing.  Fact Sheet for Patients: BloggerCourse.com  Fact Sheet for Healthcare Providers: SeriousBroker.it  This test is not yet approved or cleared by the [place] FDA and has been authorized for detection and/or diagnosis of SARS-CoV-2 by FDA under an Emergency Use Authorization (EUA). This EUA will remain in effect (meaning this test can be used) for the duration of the COVID-19 declaration under Section 564(b)(1) of the Act, 21 U.S.C. section 360bbb-3(b)(1), unless the authorization is terminated or revoked.     Resp Syncytial Virus by PCR NEGATIVE NEGATIVE Final    Comment: (NOTE) Fact Sheet for Patients: BloggerCourse.com  Fact  Sheet for Healthcare Providers: SeriousBroker.it  This test is not yet approved or cleared by the [place] FDA and has been authorized for detection and/or diagnosis of SARS-CoV-2 by FDA under an Emergency Use Authorization (EUA). This EUA will remain in effect (meaning this test can be used) for the duration of the COVID-19 declaration under Section 564(b)(1) of the Act, 21 U.S.C. section 360bbb-3(b)(1), unless the authorization is terminated or revoked.  Performed at [facility], [address], [place], [place] [address]   Culture, blood (single) w Reflex to ID Panel     Status: None (Preliminary result)   Collection Time: 04/29/22 10:49 AM  Specimen: BLOOD  Result Value Ref Range Status   Specimen Description BLOOD LEFT ANTECUBITAL  Final   Special Requests   Final    BOTTLES DRAWN AEROBIC AND ANAEROBIC Blood Culture adequate volume   Culture   Final    NO GROWTH 2 DAYS Performed at [facility], [address] [place], [place] [address]    Report Status PENDING  Incomplete         Radiology Studies: No results found.      Scheduled Meds:  amLODipine  5 mg Oral Daily   aspirin EC  81 mg Oral Daily   doxazosin  2 mg Oral Daily   enoxaparin (LOVENOX) injection  40 mg Subcutaneous Q24H   losartan  100 mg Oral Daily   metoprolol succinate  25 mg Oral QHS   oseltamivir  75 mg Oral BID   potassium chloride  40 mEq Oral Once   rosuvastatin  5 mg Oral QPM   sodium chloride flush  3 mL Intravenous Q12H     LOS: 2 days    Time spent: 35 minutes    Geradine Girt, DO [facility]  If 7PM-7AM, please contact night-coverage [email or web address] 05/01/2022, 12:11 PM

## 2022-05-01 NOTE — TOC Initial Note (Signed)
Transition of Care [facility]) - Initial/Assessment Note    Patient Details  Name: Anthony Malone MRN: [id number] Date of Birth: 1950/06/15  [facility] CM/SW Contact:    Bethena Roys, RN Phone Number: 05/01/2022, 4:43 PM  Clinical Narrative: Patient plans for discharge on 05-02-22. Patient is agreeable to home health services- PT. Patient states he will be going to [place] x one week and then back to [place]. Alvis Lemmings can service both areas. Patient is agreeable and start of care to begin within 24-48 hours post transition home. DME ordered via [employer], [place] and Rolling walker to be delivered to the room. No further needs identified at this time.                   Expected Discharge Plan: [place] Barriers to Discharge: No Barriers Identified   Patient Goals and CMS Choice Patient states their goals for this hospitalization and ongoing recovery are:: to return home. CMS Medicare.gov Compare Post Acute Care list provided to:: Patient Choice offered to / list presented to : Patient      Expected Discharge Plan and Services In-house Referral: NA Discharge Planning Services: CM Consult Post Acute Care Choice: [place] arrangements for the past 2 months: Single Family Home                 DME Arranged: Shower stool, Walker rolling, Bedside commode DME Agency: [facility] [employer] Date DME Agency Contacted: 05/01/22 Time DME Agency Contacted: 1600 Representative spoke with at DME Agency: Brenton Grills HH Arranged: PT [employer]: [place] Date [place]: 05/01/22 Time [place]: 1640 Representative spoke with at [place]: [place] Arrangements/Services Living arrangements for the past 2 months: [place] Lives with:: Spouse Patient language and need for interpreter reviewed:: Yes Do you feel safe going back to the place where you live?: Yes      Need for Family Participation  in Patient Care: Yes (Comment) Care giver support system in place?: Yes (comment)   Criminal Activity/Legal Involvement Pertinent to Current Situation/Hospitalization: No - Comment as needed  Activities of Daily Living Home Assistive Devices/Equipment: Crutches ADL Screening (condition at time of admission) Patient's cognitive ability adequate to safely complete daily activities?: Yes Is the patient deaf or have difficulty hearing?: No Does the patient have difficulty seeing, even when wearing glasses/contacts?: No Does the patient have difficulty concentrating, remembering, or making decisions?: No Patient able to express need for assistance with ADLs?: Yes Does the patient have difficulty dressing or bathing?: No Independently performs ADLs?: No Communication: Independent Dressing (OT): Independent Grooming: Independent Feeding: Independent Bathing: Needs assistance Is this a change from baseline?: Change from baseline, expected to last <3 days Toileting: Needs assistance Is this a change from baseline?: Change from baseline, expected to last <3 days In/Out Bed: Independent Does the patient have difficulty walking or climbing stairs?: Yes Weakness of Legs: Both Weakness of Arms/Hands: None  Permission Sought/Granted Permission sought to share information with : Family Supports, Case Freight forwarder, Chartered certified accountant granted to share information with : Yes, Verbal Permission Granted     Permission granted to share info w AGENCY: [facility]e [facility]        Emotional Assessment Appearance:: Appears stated age Attitude/Demeanor/Rapport: Engaged Affect (typically observed): Appropriate Orientation: : Oriented to Situation, Oriented to  Time, Oriented to Place, Oriented to Self Alcohol / Substance Use: Not Applicable Psych Involvement: No (comment)  Admission diagnosis:  Respiratory distress [R06.03] Influenza A [J10.1] Acute respiratory failure with hypoxia  ([place]) [J96.01] Patient Active Problem List   Diagnosis Date Noted   Acute respiratory failure with hypoxia ([place]) 04/29/2022   Sepsis ([place]) 04/29/2022   Influenza A 04/29/2022   Hypokalemia 04/29/2022   Hypocalcemia 04/29/2022   Renal insufficiency 04/29/2022   Chronic diastolic CHF (congestive heart failure) ([place]) 04/29/2022   Obesity, Class III, BMI 40-49.9 (morbid obesity) ([place]) 04/29/2022   CAD (coronary artery disease) 04/29/2022   Transaminitis 04/29/2022   Gout 04/29/2022   Acquired dilation of ascending aorta and aortic root (HCC)    PVC's (premature ventricular contractions) 03/20/2018   Obesity (BMI 35.0-39.9 without comorbidity) 03/20/2018   Hyperlipidemia 03/15/2017   SOB (shortness of breath) 12/24/2014   Essential hypertension, benign 11/17/2013   PCP:  Lujean Amel, MD Pharmacy:   [facility] [address] [place] [address] Phone: [phone] Fax: [phone]     Social Determinants of Health ([place]) Social History: SDOH Screenings   Food Insecurity: No Food Insecurity (04/30/2022)  Housing: Low Risk  (04/30/2022)  Transportation Needs: No Transportation Needs (04/30/2022)  Utilities: Not At Risk (04/30/2022)  Depression (PHQ2-9): Low Risk  (03/01/2021)  Tobacco Use: Low Risk  (04/29/2022)   SDOH Interventions:     Readmission Risk Interventions     No data to display

## 2022-05-02 ENCOUNTER — Other Ambulatory Visit (HOSPITAL_COMMUNITY): Payer: Self-pay

## 2022-05-02 DIAGNOSIS — J9601 Acute respiratory failure with hypoxia: Secondary | ICD-10-CM | POA: Diagnosis not present

## 2022-05-02 LAB — COMPREHENSIVE METABOLIC PANEL
ALT: 92 U/L — ABNORMAL HIGH (ref 0–44)
AST: 170 U/L — ABNORMAL HIGH (ref 15–41)
Albumin: 3 g/dL — ABNORMAL LOW (ref 3.5–5.0)
Alkaline Phosphatase: 39 U/L (ref 38–126)
Anion gap: 11 (ref 5–15)
BUN: 20 mg/dL (ref 8–23)
CO2: 27 mmol/L (ref 22–32)
Calcium: 9 mg/dL (ref 8.9–10.3)
Chloride: 102 mmol/L (ref 98–111)
Creatinine, Ser: 0.94 mg/dL (ref 0.61–1.24)
GFR, Estimated: >60 mL/min (ref >60)
Glucose, Bld: 113 mg/dL — ABNORMAL HIGH (ref 70–99)
Potassium: 3.6 mmol/L (ref 3.5–5.1)
Sodium: 140 mmol/L (ref 135–145)
Total Bilirubin: 0.9 mg/dL (ref 0.3–1.2)
Total Protein: 6 g/dL — ABNORMAL LOW (ref 6.5–8.1)

## 2022-05-02 LAB — CBC
HCT: 34 % — ABNORMAL LOW (ref 39.0–52.0)
Hemoglobin: 11.7 g/dL — ABNORMAL LOW (ref 13.0–17.0)
MCH: 29 pg (ref 26.0–34.0)
MCHC: 34.4 g/dL (ref 30.0–36.0)
MCV: 84.4 fL (ref 80.0–100.0)
Platelets: 119 10*3/uL — ABNORMAL LOW (ref 150–400)
RBC: 4.03 MIL/uL — ABNORMAL LOW (ref 4.22–5.81)
RDW: 13.8 % (ref 11.5–15.5)
WBC: 3 10*3/uL — ABNORMAL LOW (ref 4.0–10.5)
nRBC: 0 % (ref 0.0–0.2)

## 2022-05-02 LAB — MAGNESIUM: Magnesium: 1.7 mg/dL (ref 1.7–2.4)

## 2022-05-02 MED ORDER — OSELTAMIVIR PHOSPHATE 75 MG PO CAPS
75.0000 mg | ORAL_CAPSULE | Freq: Two times a day (BID) | ORAL | 0 refills | Status: DC
  Filled 2022-05-02: qty 4, 2d supply, fill #0

## 2022-05-02 MED ORDER — DOXYCYCLINE HYCLATE 100 MG PO TABS
100.0000 mg | ORAL_TABLET | Freq: Two times a day (BID) | ORAL | 0 refills | Status: DC
  Filled 2022-05-02: qty 6, 3d supply, fill #0

## 2022-05-02 MED ORDER — DOXYCYCLINE HYCLATE 100 MG PO TABS
100.0000 mg | ORAL_TABLET | Freq: Two times a day (BID) | ORAL | Status: DC
  Administered 2022-05-02: 100 mg via ORAL
  Filled 2022-05-02: qty 1

## 2022-05-02 MED ORDER — ALBUTEROL SULFATE HFA 108 (90 BASE) MCG/ACT IN AERS
2.0000 | INHALATION_SPRAY | Freq: Four times a day (QID) | RESPIRATORY_TRACT | 0 refills | Status: DC | PRN
  Filled 2022-05-02: qty 6.7, 25d supply, fill #0

## 2022-05-02 NOTE — Discharge Summary (Signed)
Physician Discharge Summary  Anthony Malone:[id number] DOB: 1950-07-25 DOA: 04/29/2022  PCP: Darrow Bussing, MD  Admit date: 04/29/2022 Discharge date: 05/02/2022  Admitted From: home Discharge disposition: home   Recommendations for Outpatient Follow-Up:   Home health Follow respiratory cultures Cbc, cmp at next visit-- follow LFTs   Discharge Diagnosis:   Principal Problem:   Acute respiratory failure with hypoxia (HCC) Active Problems:   Sepsis (HCC)   Influenza A   Hypokalemia   Hypocalcemia   Renal insufficiency   Chronic diastolic CHF (congestive heart failure) (HCC)   CAD (coronary artery disease)   Essential hypertension, benign   Transaminitis   Hyperlipidemia   Gout   Acquired dilation of ascending aorta and aortic root (HCC)   Obesity, Class III, BMI 40-49.9 (morbid obesity) (HCC)    Discharge Condition: Improved.  Diet recommendation: Low sodium, heart health  Wound care: None.  Code status: Full.   History of Present Illness:   Anthony Malone is a 72 y.o. male with medical history significant of hypertension, hyperlipidemia, CAD, CHF, AAA, and obesity who presents with complaints of shortness of breath and cough over the last week.  Him and his wife had visited his son and family and [place] for Christmas which is a 3-1/2  to 4-hour drive, but reported stopping at least once to get down there.  On the way back they stopped [place] which is about a 2-hour drive.  He notes that his son and family had been sick with something, but did not think that he got symptoms from them.  Since getting back home he reports having mildly productive cough with coughing spells.  Associated symptoms included myalgias, loose stools, and weakness.  Denies having any nausea, vomiting, abdominal pain, or leg swelling to his knowledge.  Patient normally can ambulate around the house without need of assistance and does not require oxygen.  However, over the  last couple days he had difficulty standing up and moving around for which he was using 2 canes and a rolling chair at home.  He had to call her people to help get up 1 instance from the chair.  Normally he only used a cane to get around if outside of the house for prolonged period of time or uneven ground.  He reports having significant arthritis in both knees for which he sometimes uses braces.  Last night he had difficulty taking a deep breath in and became anxious for which EMS was called.  Denies any significant history of tobacco use or daily alcohol use.   In route with EMS patient had been found to have O2 saturations at 88% on room air with improvement on 6 L nasal cannula oxygen.  Patient had been given 1 DuoNeb prior to arrival.   In the emergency department patient was noted to be febrile up to 102.9 F with tachycardia, tachypnea, and O2 saturation currently maintained on 2 L nasal cannula oxygen.  Labs significant for WBC 3.9, hemoglobin 12.7, potassium 3, BUN 24, creatinine 1.2, calcium 8.6, AST 165, and ALT 64.  Influenza a screen positive.  Chest x-ray showed no acute abnormality. Patient had been given 500 mL normal saline IV fluids, acetaminophen at 1000 mg p.o., and started on Tamiflu.   Hospital Course by Problem:   Sepsis  Acute respiratory failure with hypoxia due to influenza A and superimposed bacterial PNA On admission patient was noted to be febrile up to 102.9 F with tachycardia, tachypnea, and  WBC 3.9 meeting SIRS criteria.  O2 saturations noted to be 88% on room air with improvement on 6 L of nasal cannula oxygen.  Chest x-ray was noted no acute abnormality influenza a screening positive.  Patient had been given 500 mL normal saline IV fluids, acetaminophen at 1000 mg p.o., and started on Tamiflu.  No initial lactic acid hadbeen obtained. -D-dimer elevated, but no suspicion of PE given that oxygen levels are being weaned, and patient denies any chest pain, hemoptysis, or  lower extremity edema- dvt studies negative -Continue Tamiflu x 5 days -doxy PO -Mucinex -weaned to room air   Hypokalemia Hypocalcemia hypomagnesemia -repleted   Renal insufficiency -resolved   Diastolic congestive heart failure Chronic.  Patient does not appear grossly fluid overloaded at this time.  BNP was 56 and chest x-ray did not show any signs for edema with mild cardiomegaly.  Last EF noted to be 50-55% with grade 2 diastolic dysfunction back in 06/2019.    CAD Patient denies any complaints of chest pain.  Coronary CTA was done in January 2022 for atypical symptoms showing a coronary calcium score 323. There was mild CAD of the RCA and proximal and mid LAD with 25 -49% proximal RCA stenosis and proximal LAD stenosis with normal FFR.  He has a follow-up appointment with Dr. Radford Pax      Essential hypertension On admission blood pressures noted to be maintained. -resume home meds   Transaminitis Acute.  Labs revealed AST 165 and ALT 64.  Patient denies drinking on a daily basis.   -outpatient follow up to ensure resolution   Hyperlipidemia -Resume Crestor outpateint   Gout, without acute flare Patient no longer on allopurinol and reports taking tart cherry extract.  Denies any symptoms of acute gout flare at this time. -Continue tart cherry extract in the outpatient setting   AAA Patient has a ascending aortic aneurysm last noted to be 4.4 cm by CT scan of the chest from 12/19/2021. -Continue annual screening surveillance   Morbid obesity Estimated body mass index is 39.11 kg/m as calculated from the following:   Height as of this encounter: 6' (1.829 m).   Weight as of this encounter: 130.8 kg.         Medical Consultants:      Discharge Exam:   Vitals:   05/02/22 0456 05/02/22 0856  BP: (!) 151/86 (!) 148/77  Pulse: 65 68  Resp: 20   Temp: 98.4 F (36.9 C) 98.4 F (36.9 C)  SpO2: 97%    Vitals:   05/01/22 1328 05/01/22 2119 05/02/22 0456  05/02/22 0856  BP: (!) 150/88 (!) 133/101 (!) 151/86 (!) 148/77  Pulse: 76 85 65 68  Resp: 20 20 20    Temp: 98.5 F (36.9 C) 98.7 F (37.1 C) 98.4 F (36.9 C) 98.4 F (36.9 C)  TempSrc: Oral Oral Oral Oral  SpO2: 95% 93% 97%   Weight:   130.8 kg   Height:        General exam: Appears calm and comfortable.    The results of significant diagnostics from this hospitalization (including imaging, microbiology, ancillary and laboratory) are listed below for reference.     Procedures and Diagnostic Studies:   DG Chest Port 1 View  Result Date: 04/29/2022 CLINICAL DATA:  Shortness of breath, congestion and cough. EXAM: PORTABLE CHEST 1 VIEW COMPARISON:  Chest CT no contrast 12/19/2021. FINDINGS: There is mild cardiomegaly. No vascular congestion is seen. There is aortic tortuosity and atherosclerosis with stable mediastinum.  The lungs are clear. No pleural effusion is seen. Thoracic spondylosis. Stable overall aeration. IMPRESSION: No acute chest findings. Aortic atherosclerosis and tortuosity. Electronically Signed   By: Telford Nab M.D.   On: 04/29/2022 05:04     Labs:   Basic Metabolic Panel: Recent Labs  Lab 04/29/22 0313 04/29/22 0335 04/30/22 0127 05/01/22 0142 05/02/22 0248  NA 136 136 135 138 140  K 3.0* 3.0* 3.1* 3.4* 3.6  CL 100  --  105 104 102  CO2 21*  --  24 25 27   GLUCOSE 136*  --  99 111* 113*  BUN 24*  --  23 23 20   CREATININE 1.20  --  1.12 1.06 0.94  CALCIUM 8.6*  --  8.2* 8.3* 9.0  MG  --   --   --  1.4* 1.7   GFR Estimated Creatinine Clearance: 97.9 mL/min (by C-G formula based on SCr of 0.94 mg/dL). Liver Function Tests: Recent Labs  Lab 04/29/22 0313 05/01/22 0142 05/02/22 0248  AST 165* 172* 170*  ALT 64* 78* 92*  ALKPHOS 33* 33* 39  BILITOT 1.5* 0.8 0.9  PROT 6.5 5.7* 6.0*  ALBUMIN 3.4* 2.8* 3.0*   No results for input(s): "LIPASE", "AMYLASE" in the last 168 hours. No results for input(s): "AMMONIA" in the last 168  hours. Coagulation profile No results for input(s): "INR", "PROTIME" in the last 168 hours.  CBC: Recent Labs  Lab 04/29/22 0313 04/29/22 0335 04/30/22 0127 05/01/22 0142 05/02/22 0248  WBC 3.9*  --  2.8* 2.3* 3.0*  NEUTROABS 2.3  --   --   --   --   HGB 12.7* 12.9* 10.8* 11.2* 11.7*  HCT 38.7* 38.0* 32.8* 32.5* 34.0*  MCV 86.0  --  85.6 84.4 84.4  PLT PLATELET CLUMPS NOTED ON SMEAR, COUNT APPEARS ADEQUATE  --  84* 97* 119*   Cardiac Enzymes: No results for input(s): "CKTOTAL", "CKMB", "CKMBINDEX", "TROPONINI" in the last 168 hours. BNP: Invalid input(s): "POCBNP" CBG: No results for input(s): "GLUCAP" in the last 168 hours. D-Dimer No results for input(s): "DDIMER" in the last 72 hours. Hgb A1c No results for input(s): "HGBA1C" in the last 72 hours. Lipid Profile No results for input(s): "CHOL", "HDL", "LDLCALC", "TRIG", "CHOLHDL", "LDLDIRECT" in the last 72 hours. Thyroid function studies No results for input(s): "TSH", "T4TOTAL", "T3FREE", "THYROIDAB" in the last 72 hours.  Invalid input(s): "FREET3" Anemia work up No results for input(s): "VITAMINB12", "FOLATE", "FERRITIN", "TIBC", "IRON", "RETICCTPCT" in the last 72 hours. Microbiology Recent Results (from the past 240 hour(s))  Resp panel by RT-PCR (RSV, Flu A&B, Covid) Anterior Nasal Swab     Status: Abnormal   Collection Time: 04/29/22  3:10 AM   Specimen: Anterior Nasal Swab  Result Value Ref Range Status   SARS Coronavirus 2 by RT PCR NEGATIVE NEGATIVE Final    Comment: (NOTE) SARS-CoV-2 target nucleic acids are NOT DETECTED.  The SARS-CoV-2 RNA is generally detectable in upper respiratory specimens during the acute phase of infection. The lowest concentration of SARS-CoV-2 viral copies this assay can detect is 138 copies/mL. A negative result does not preclude SARS-Cov-2 infection and should not be used as the sole basis for treatment or other patient management decisions. A negative result may occur  with  improper specimen collection/handling, submission of specimen other than nasopharyngeal swab, presence of viral mutation(s) within the areas targeted by this assay, and inadequate number of viral copies(<138 copies/mL). A negative result must be combined with clinical observations, patient history, and  epidemiological information. The expected result is Negative.  Fact Sheet for Patients:  BloggerCourse.com  Fact Sheet for Healthcare Providers:  SeriousBroker.it  This test is no t yet approved or cleared by the [place] FDA and  has been authorized for detection and/or diagnosis of SARS-CoV-2 by FDA under an Emergency Use Authorization (EUA). This EUA will remain  in effect (meaning this test can be used) for the duration of the COVID-19 declaration under Section 564(b)(1) of the Act, 21 U.S.C.section 360bbb-3(b)(1), unless the authorization is terminated  or revoked sooner.       Influenza A by PCR POSITIVE (A) NEGATIVE Final   Influenza B by PCR NEGATIVE NEGATIVE Final    Comment: (NOTE) The Xpert Xpress SARS-CoV-2/FLU/RSV plus assay is intended as an aid in the diagnosis of influenza from Nasopharyngeal swab specimens and should not be used as a sole basis for treatment. Nasal washings and aspirates are unacceptable for Xpert Xpress SARS-CoV-2/FLU/RSV testing.  Fact Sheet for Patients: BloggerCourse.com  Fact Sheet for Healthcare Providers: SeriousBroker.it  This test is not yet approved or cleared by the [place] FDA and has been authorized for detection and/or diagnosis of SARS-CoV-2 by FDA under an Emergency Use Authorization (EUA). This EUA will remain in effect (meaning this test can be used) for the duration of the COVID-19 declaration under Section 564(b)(1) of the Act, 21 U.S.C. section 360bbb-3(b)(1), unless the authorization is terminated  or revoked.     Resp Syncytial Virus by PCR NEGATIVE NEGATIVE Final    Comment: (NOTE) Fact Sheet for Patients: BloggerCourse.com  Fact Sheet for Healthcare Providers: SeriousBroker.it  This test is not yet approved or cleared by the [place] FDA and has been authorized for detection and/or diagnosis of SARS-CoV-2 by FDA under an Emergency Use Authorization (EUA). This EUA will remain in effect (meaning this test can be used) for the duration of the COVID-19 declaration under Section 564(b)(1) of the Act, 21 U.S.C. section 360bbb-3(b)(1), unless the authorization is terminated or revoked.  Performed at [facility], [address], [place], [place] [address]   Culture, blood (single) w Reflex to ID Panel     Status: None (Preliminary result)   Collection Time: 04/29/22 10:49 AM   Specimen: BLOOD  Result Value Ref Range Status   Specimen Description BLOOD LEFT ANTECUBITAL  Final   Special Requests   Final    BOTTLES DRAWN AEROBIC AND ANAEROBIC Blood Culture adequate volume   Culture   Final    NO GROWTH 3 DAYS Performed at [facility], [address] [place], [place] [address]    Report Status PENDING  Incomplete  Expectorated Sputum Assessment w Gram Stain, Rflx to Resp Cult     Status: None   Collection Time: 05/01/22  1:29 PM   Specimen: Expectorated Sputum  Result Value Ref Range Status   Specimen Description EXPECTORATED SPUTUM  Final   Special Requests NONE  Final   Sputum evaluation   Final    THIS SPECIMEN IS ACCEPTABLE FOR SPUTUM CULTURE Performed at [facility], [address], [place], [place] [address]    Report Status 05/01/2022 FINAL  Final  Culture, Respiratory w Gram Stain     Status: None (Preliminary result)   Collection Time: 05/01/22  1:29 PM  Result Value Ref Range Status   Specimen Description EXPECTORATED SPUTUM  Final   Special Requests NONE Reflexed from [id number]  Final    Gram Stain   Final  MODERATE WBC PRESENT, PREDOMINANTLY PMN MODERATE GRAM POSITIVE COCCI IN PAIRS IN CHAINS RARE GRAM POSITIVE RODS RARE SQUAMOUS EPITHELIAL CELLS PRESENT Performed at [facility], [address], [place], [place] [address]    Culture PENDING  Incomplete   Report Status PENDING  Incomplete     Discharge Instructions:   Discharge Instructions     Diet - low sodium heart healthy   Complete by: As directed    Increase activity slowly   Complete by: As directed       Allergies as of 05/02/2022       Reactions   Ibuprofen Rash   High dosages causes rash        Medication List     TAKE these medications    albuterol 108 (90 Base) MCG/ACT inhaler Commonly known as: VENTOLIN HFA Inhale 2 puffs into the lungs every 6 (six) hours as needed for wheezing or shortness of breath.   amLODipine 5 MG tablet Commonly known as: NORVASC Take 1 tablet (5 mg total) by mouth daily.   aspirin 81 MG tablet Take 81 mg by mouth daily.   chlorthalidone 50 MG tablet Commonly known as: HYGROTON Take 1 tablet by mouth once daily   doxazosin 2 MG tablet Commonly known as: CARDURA Take 1 tablet (2 mg total) by mouth at bedtime. What changed: when to take this   doxycycline 100 MG tablet Commonly known as: VIBRA-TABS Take 1 tablet (100 mg total) by mouth every 12 (twelve) hours.   losartan 100 MG tablet Commonly known as: COZAAR Take 1 tablet (100 mg total) by mouth daily.   metoprolol succinate 25 MG 24 hr tablet Commonly known as: TOPROL-XL Take 1 tablet by mouth once daily   naproxen 500 MG tablet Commonly known as: NAPROSYN Take 500 mg by mouth as needed for mild pain or moderate pain (joint pain).   oseltamivir 75 MG capsule Commonly known as: TAMIFLU Take 1 capsule (75 mg total) by mouth 2 (two) times daily.   rosuvastatin 5 MG tablet Commonly known as: CRESTOR Take 1 tablet by mouth once daily   TART CHERRY PO Take 500 mg by mouth 2 (two)  times daily.               Durable Medical Equipment  (From admission, onward)           Start     Ordered   05/01/22 1609  For home use only DME Shower stool  Once        05/01/22 1608   05/01/22 1608  For home use only DME Bedside commode  Once       Question:  Patient needs a bedside commode to treat with the following condition  Answer:  General weakness   05/01/22 1608   05/01/22 1608  For home use only DME Walker rolling  Once       Question Answer Comment  Walker: With 5 Inch Wheels   Patient needs a walker to treat with the following condition General weakness      05/01/22 1608            Follow-up Information     [facility] Follow up.   Why: Bedside Commode, Shower chair, and rolling walker-to be delivered to the room prior home. Contact information: Address: [address], [place], [place] [address] Phone: [phone]        [facility], [facility] Follow up.   Specialty: Home Health Services Why: Physical  The[facility]. Contact information: [address] [place] [id number] [phone]                  Time coordinating discharge: 45 min  Signed:  Joseph ArtJessica U Manette Doto DO  [facility] 05/02/2022, 10:21 AM

## 2022-05-04 LAB — CULTURE, RESPIRATORY W GRAM STAIN

## 2022-05-04 LAB — CULTURE, BLOOD (SINGLE)
Culture: NO GROWTH
Special Requests: ADEQUATE

## 2022-05-10 ENCOUNTER — Encounter: Payer: Self-pay | Admitting: Cardiology

## 2022-05-10 ENCOUNTER — Ambulatory Visit: Payer: Medicare Other | Attending: Cardiology | Admitting: Cardiology

## 2022-05-10 VITALS — BP 120/76 | HR 72 | Ht 72.0 in | Wt 287.0 lb

## 2022-05-10 DIAGNOSIS — R0602 Shortness of breath: Secondary | ICD-10-CM

## 2022-05-10 DIAGNOSIS — I2583 Coronary atherosclerosis due to lipid rich plaque: Secondary | ICD-10-CM

## 2022-05-10 DIAGNOSIS — I493 Ventricular premature depolarization: Secondary | ICD-10-CM

## 2022-05-10 DIAGNOSIS — I10 Essential (primary) hypertension: Secondary | ICD-10-CM | POA: Diagnosis not present

## 2022-05-10 DIAGNOSIS — E7800 Pure hypercholesterolemia, unspecified: Secondary | ICD-10-CM | POA: Diagnosis not present

## 2022-05-10 DIAGNOSIS — I2510 Atherosclerotic heart disease of native coronary artery without angina pectoris: Secondary | ICD-10-CM

## 2022-05-10 DIAGNOSIS — I77810 Thoracic aortic ectasia: Secondary | ICD-10-CM

## 2022-05-10 DIAGNOSIS — I77819 Aortic ectasia, unspecified site: Secondary | ICD-10-CM

## 2022-05-10 LAB — CBC

## 2022-05-10 NOTE — Addendum Note (Signed)
Addended by: Molli Barrows on: 05/10/2022 08:49 AM   Modules accepted: Orders

## 2022-05-10 NOTE — Progress Notes (Signed)
Cardiology Office Note:    Date:  05/10/2022   ID:  SOREN LAZARZ, DOB 1950-09-22, MRN [id number]  PCP:  Lujean Amel, MD  Cardiologist:  Fransico Him, MD    Referring MD: Lujean Amel, MD   Chief Complaint  Patient presents with   Coronary Artery Disease   Hypertension   Hyperlipidemia    History of Present Illness:    Anthony Malone is a 72 y.o. male with a hx of HTN and chest pain.  He had a stress test for some exertional fatigue which was normal.  Coronary CTA was done in January 2022 for atypical symptoms showing a coronary calcium score 323.  There was mild CAD of the RCA and proximal and mid LAD with 25 to 49% proximal RCA stenosis and proximal LAD stenosis with normal FFR.  He is here today for followup and is doing well.  Since I saw him last he was hospitalized with PNA and influenza.  He is sedentary and walks with a cane so has some DOE chronically that he thinks is stable. He does get fatigued quicker than he used to . He denies any chest pain or pressure,  PND, orthopnea,  dizziness, palpitations or syncope. He has chronic LE edema L>R which is stable. He is compliant with his meds and is tolerating meds with no SE.    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  Current Medications: Current Meds  Medication Sig   albuterol (VENTOLIN HFA) 108 (90 Base) MCG/ACT inhaler Inhale 2 puffs into the lungs every 6 (six) hours as needed for wheezing or shortness of breath.   amLODipine (NORVASC) 5 MG tablet Take 1 tablet (5 mg  total) by mouth daily.   aspirin 81 MG tablet Take 81 mg by mouth daily.   chlorthalidone (HYGROTON) 50 MG tablet Take 1 tablet by mouth once daily   doxazosin (CARDURA) 2 MG tablet Take 1 tablet (2 mg total) by mouth at bedtime.   losartan (COZAAR) 100 MG tablet Take 1 tablet (100 mg total) by mouth daily.   metoprolol succinate (TOPROL-XL) 25 MG 24 hr tablet Take 1 tablet by mouth once daily   naproxen (NAPROSYN) 500 MG tablet Take 500 mg by mouth as needed for mild pain or moderate pain (joint pain).   rosuvastatin (CRESTOR) 5 MG tablet Take 1 tablet by mouth once daily   TART CHERRY PO Take 500 mg by mouth 2 (two) times daily.   [DISCONTINUED] doxycycline (VIBRA-TABS) 100 MG tablet Take 1 tablet (100 mg total) by mouth every 12 (twelve) hours.   [DISCONTINUED] oseltamivir (TAMIFLU) 75 MG capsule Take 1 capsule (75 mg total) by mouth 2 (two) times daily.     Allergies:   Ibuprofen   Social History   Socioeconomic History   Marital status: Married    Spouse name: Not on file   Number of children: Not on file  Years of education: Not on file   Highest education level: Not on file  Occupational History   Not on file  Tobacco Use   Smoking status: Never   Smokeless tobacco: Never  Vaping Use   Vaping Use: Never used  Substance and Sexual Activity   Alcohol use: Yes    Comment: rare   Drug use: No   Sexual activity: Not on file  Other Topics Concern   Not on file  Social History Narrative   Not on file   Social Determinants of Health   Financial Resource Strain: Not on file  Food Insecurity: No Food Insecurity (04/30/2022)   Hunger Vital Sign    Worried About Running Out of Food in the Last Year: Never true    Ran Out of Food in the Last Year: Never true  Transportation Needs: No Transportation Needs (04/30/2022)   PRAPARE - Hydrologist (Medical): No    Lack of Transportation (Non-Medical): No  Physical Activity: Not on file  Stress: Not  on file  Social Connections: Not on file     Family History: The patient's family history includes Breast cancer in his mother; CVA in his father; Hypertension in his father and mother.  ROS:   Please see the history of present illness.    ROS  All other systems reviewed and negative.   EKGs/Labs/Other Studies Reviewed:    The following studies were reviewed today: none  EKG:  EKG is not ordered today.    Recent Labs: 04/29/2022: B Natriuretic Peptide 56.0 05/02/2022: ALT 92; BUN 20; Creatinine, Ser 0.94; Hemoglobin 11.7; Magnesium 1.7; Platelets 119; Potassium 3.6; Sodium 140   Recent Lipid Panel    Component Value Date/Time   CHOL 108 03/01/2021 0846   TRIG 135 03/01/2021 0846   HDL 34 (L) 03/01/2021 0846   CHOLHDL 3.2 03/01/2021 0846   LDLCALC 50 03/01/2021 0846    Physical Exam:    VS:  BP 120/76   Pulse 72   Ht 6' (1.829 m)   Wt 287 lb (130.2 kg)   SpO2 96%   BMI 38.92 kg/m     Wt Readings from Last 3 Encounters:  05/10/22 287 lb (130.2 kg)  05/02/22 288 lb 6.4 oz (130.8 kg)  03/01/21 285 lb (129.3 kg)    GEN: Well nourished, well developed in no acute distress HEENT: Normal NECK: No JVD; No carotid bruits LYMPHATICS: No lymphadenopathy CARDIAC:RRR, no murmurs, rubs, gallops RESPIRATORY:  Clear to auscultation without rales, wheezing or rhonchi  ABDOMEN: Soft, non-tender, non-distended MUSCULOSKELETAL:  No edema; No deformity  SKIN: Warm and dry NEUROLOGIC:  Alert and oriented x 3 PSYCHIATRIC:  Normal affect  ASSESSMENT:    1. Essential hypertension, benign   2. Pure hypercholesterolemia   3. PVC's (premature ventricular contractions)   4. Coronary artery disease due to lipid rich plaque    PLAN:    In order of problems listed above:  1.  HTN -BP is controlled on exam today -Continue prescription management with amlodipine 5 mg daily, chlorthalidone 50 mg daily, doxazosin 2 mg daily, losartan 100 mg daily, Toprol-XL 25 mg daily with as needed  refills --I have personally reviewed and interpreted outside labs performed by patient's PCP which showed  SCr 0.94 and K+ 3.6 on 05/02/2022.  2.  HLD -followed by PCP -LDL goal is less than 70  -repeat FLP and ALT -Continue prescription management with received 5 mg daily with as needed refills  3.  PVCs -Noted on EKG in the past -he remains a demonic  4.  ASCAD -Coronary CTA was done in January 2022 for atypical symptoms showing a coronary calcium score 323.  There was mild CAD of the RCA and proximal and mid LAD with 25 to 49% proximal RCA stenosis and proximal LAD stenosis with normal FFR. -He has not had any anginal symptoms but has been more fatigued over the past year -I will get a Lexiscan myoview to rule out ischemia -Shared Decision Making/Informed Consent The risks [chest pain, shortness of breath, cardiac arrhythmias, dizziness, blood pressure fluctuations, myocardial infarction, stroke/transient ischemic attack, nausea, vomiting, allergic reaction, radiation exposure, metallic taste sensation and life-threatening complications (estimated to be 1 in 10,000)], benefits (risk stratification, diagnosing coronary artery disease, treatment guidance) and alternatives of a nuclear stress test were discussed in detail with Anthony Malone and he agrees to proceed. -Continue prescription drug managed with aspirin 81 mg daily, Toprol-XL 25 mg daily and statin therapy.  5.  Dilated aorta -chest CTA  2022 showed ascending aorta 4.4cm -repeat chest CTA to reassess    Medication Adjustments/Labs and Tests Ordered: Current medicines are reviewed at length with the patient today.  Concerns regarding medicines are outlined above.  No orders of the defined types were placed in this encounter.   No orders of the defined types were placed in this encounter.   Signed, Armanda Magic, MD  05/10/2022 8:34 AM    [facility]

## 2022-05-10 NOTE — Addendum Note (Signed)
Addended by: Sueanne Margarita on: 05/10/2022 08:49 AM   Modules accepted: Orders

## 2022-05-10 NOTE — Patient Instructions (Signed)
Medication Instructions:  Your physician recommends that you continue on your current medications as directed. Please refer to the Current Medication list given to you today.  *If you need a refill on your cardiac medications before your next appointment, please call your pharmacy*  Lab Work: TODAY: fasting Lipid panel, CMET, CBC If you have labs (blood work) drawn today and your tests are completely normal, you will receive your results only by: [place] (if you have MyChart) OR A paper copy in the mail If you have any lab test that is abnormal or we need to change your treatment, we will call you to review the results.  Testing/Procedures: Your physician has recommended a CT angio chest/aorta to be performed.  Your physician has requested that you have a lexiscan myoview. For further information please visit [email or web address]. Please follow instruction sheet, as given.   Follow-Up: At [facility], you and your health needs are our priority.  As part of our continuing mission to provide you with exceptional heart care, we have created designated Provider Care Teams.  These Care Teams include your primary Cardiologist (physician) and Advanced Practice Providers (APPs -  Physician Assistants and Nurse Practitioners) who all work together to provide you with the care you need, when you need it.  Your next appointment:   1 year(s)  The format for your next appointment:   In Person  Provider:   Fransico Him, MD  or Melina Copa, PA-C or Ermalinda Barrios, PA-C  Important Information About Sugar

## 2022-05-11 ENCOUNTER — Telehealth (HOSPITAL_COMMUNITY): Payer: Self-pay | Admitting: *Deleted

## 2022-05-11 LAB — CBC
Hematocrit: 39.3 % (ref 37.5–51.0)
Hemoglobin: 12.7 g/dL — ABNORMAL LOW (ref 13.0–17.7)
MCH: 28.1 pg (ref 26.6–33.0)
MCHC: 32.3 g/dL (ref 31.5–35.7)
MCV: 87 fL (ref 79–97)
Platelets: 224 10*3/uL (ref 150–450)
RBC: 4.52 x10E6/uL (ref 4.14–5.80)
RDW: 13.3 % (ref 11.6–15.4)
WBC: 4.5 10*3/uL (ref 3.4–10.8)

## 2022-05-11 LAB — LIPID PANEL
Chol/HDL Ratio: 2.7 ratio (ref 0.0–5.0)
Cholesterol, Total: 99 mg/dL — ABNORMAL LOW (ref 100–199)
HDL: 37 mg/dL — ABNORMAL LOW (ref >39)
LDL Chol Calc (NIH): 42 mg/dL (ref 0–99)
Triglycerides: 110 mg/dL (ref 0–149)
VLDL Cholesterol Cal: 20 mg/dL (ref 5–40)

## 2022-05-11 LAB — COMPREHENSIVE METABOLIC PANEL
ALT: 78 IU/L — ABNORMAL HIGH (ref 0–44)
AST: 43 IU/L — ABNORMAL HIGH (ref 0–40)
Albumin/Globulin Ratio: 1.8 (ref 1.2–2.2)
Albumin: 4.2 g/dL (ref 3.8–4.8)
Alkaline Phosphatase: 63 IU/L (ref 44–121)
BUN/Creatinine Ratio: 25 — ABNORMAL HIGH (ref 10–24)
BUN: 26 mg/dL (ref 8–27)
Bilirubin Total: 0.9 mg/dL (ref 0.0–1.2)
CO2: 24 mmol/L (ref 20–29)
Calcium: 9.6 mg/dL (ref 8.6–10.2)
Chloride: 101 mmol/L (ref 96–106)
Creatinine, Ser: 1.03 mg/dL (ref 0.76–1.27)
Globulin, Total: 2.4 g/dL (ref 1.5–4.5)
Glucose: 90 mg/dL (ref 70–99)
Potassium: 5.1 mmol/L (ref 3.5–5.2)
Sodium: 138 mmol/L (ref 134–144)
Total Protein: 6.6 g/dL (ref 6.0–8.5)
eGFR: 77 mL/min/{1.73_m2} (ref >59)

## 2022-05-11 NOTE — Telephone Encounter (Signed)
Patient given detailed instructions per Myocardial Perfusion Study Information Sheet for the test on 05/17/2022 at 8:15. Patient notified to arrive 15 minutes early and that it is imperative to arrive on time for appointment to keep from having the test rescheduled.  If you need to cancel or reschedule your appointment, please call the office within 24 hours of your appointment. . Patient verbalized understanding.Anthony Malone

## 2022-05-17 ENCOUNTER — Ambulatory Visit (HOSPITAL_COMMUNITY): Payer: Medicare Other | Attending: Cardiology

## 2022-05-17 DIAGNOSIS — R0602 Shortness of breath: Secondary | ICD-10-CM

## 2022-05-17 MED ORDER — REGADENOSON 0.4 MG/5ML IV SOLN
0.4000 mg | Freq: Once | INTRAVENOUS | Status: AC
  Administered 2022-05-17: 0.4 mg via INTRAVENOUS

## 2022-05-17 MED ORDER — TECHNETIUM TC 99M TETROFOSMIN IV KIT
32.0000 | PACK | Freq: Once | INTRAVENOUS | Status: AC | PRN
  Administered 2022-05-17: 32 via INTRAVENOUS

## 2022-05-18 ENCOUNTER — Ambulatory Visit (HOSPITAL_COMMUNITY): Payer: Medicare Other | Attending: Internal Medicine

## 2022-05-18 LAB — MYOCARDIAL PERFUSION IMAGING
LV dias vol: 143 mL (ref 62–150)
LV sys vol: 59 mL
Nuc Stress EF: 59 %
Peak HR: 86 {beats}/min
Rest HR: 63 {beats}/min
Rest Nuclear Isotope Dose: 32.6 mCi
SDS: 0
SRS: 2
SSS: 2
ST Depression (mm): 0 mm
Stress Nuclear Isotope Dose: 32 mCi
TID: 0.95

## 2022-05-18 MED ORDER — TECHNETIUM TC 99M TETROFOSMIN IV KIT
32.6000 | PACK | Freq: Once | INTRAVENOUS | Status: AC | PRN
  Administered 2022-05-18: 32.6 via INTRAVENOUS

## 2022-05-19 DIAGNOSIS — Z0001 Encounter for general adult medical examination with abnormal findings: Secondary | ICD-10-CM | POA: Diagnosis not present

## 2022-05-19 DIAGNOSIS — Z79899 Other long term (current) drug therapy: Secondary | ICD-10-CM | POA: Diagnosis not present

## 2022-05-19 DIAGNOSIS — E7800 Pure hypercholesterolemia, unspecified: Secondary | ICD-10-CM | POA: Diagnosis not present

## 2022-05-19 DIAGNOSIS — I10 Essential (primary) hypertension: Secondary | ICD-10-CM | POA: Diagnosis not present

## 2022-05-19 DIAGNOSIS — M109 Gout, unspecified: Secondary | ICD-10-CM | POA: Diagnosis not present

## 2022-05-19 DIAGNOSIS — R7301 Impaired fasting glucose: Secondary | ICD-10-CM | POA: Diagnosis not present

## 2022-05-19 DIAGNOSIS — I7121 Aneurysm of the ascending aorta, without rupture: Secondary | ICD-10-CM | POA: Diagnosis not present

## 2022-05-19 DIAGNOSIS — I700 Atherosclerosis of aorta: Secondary | ICD-10-CM | POA: Diagnosis not present

## 2022-05-23 DIAGNOSIS — K080 Exfoliation of teeth due to systemic causes: Secondary | ICD-10-CM | POA: Diagnosis not present

## 2022-05-24 ENCOUNTER — Ambulatory Visit (HOSPITAL_COMMUNITY)
Admission: RE | Admit: 2022-05-24 | Discharge: 2022-05-24 | Disposition: A | Discharge status: Home / Self Care | Payer: Medicare Other | Source: Ambulatory Visit | Attending: Cardiology | Admitting: Cardiology

## 2022-05-24 DIAGNOSIS — I359 Nonrheumatic aortic valve disorder, unspecified: Secondary | ICD-10-CM | POA: Diagnosis not present

## 2022-05-24 DIAGNOSIS — I728 Aneurysm of other specified arteries: Secondary | ICD-10-CM | POA: Diagnosis not present

## 2022-05-24 DIAGNOSIS — I77819 Aortic ectasia, unspecified site: Secondary | ICD-10-CM | POA: Diagnosis not present

## 2022-05-24 DIAGNOSIS — I712 Thoracic aortic aneurysm, without rupture, unspecified: Secondary | ICD-10-CM | POA: Diagnosis not present

## 2022-05-24 DIAGNOSIS — I2510 Atherosclerotic heart disease of native coronary artery without angina pectoris: Secondary | ICD-10-CM | POA: Diagnosis not present

## 2022-05-24 MED ORDER — IOHEXOL 350 MG/ML SOLN
75.0000 mL | Freq: Once | INTRAVENOUS | Status: AC | PRN
  Administered 2022-05-24: 75 mL via INTRAVENOUS

## 2022-05-27 ENCOUNTER — Encounter: Payer: Self-pay | Admitting: Cardiology

## 2022-05-28 ENCOUNTER — Other Ambulatory Visit: Payer: Self-pay | Admitting: Cardiology

## 2022-06-21 DIAGNOSIS — D72819 Decreased white blood cell count, unspecified: Secondary | ICD-10-CM | POA: Diagnosis not present

## 2022-06-23 IMAGING — CT CTA CHEST W/ AND/OR W/O CM W/ DISSECTION AND GATING
1 of 9 series · 2 of 16 positions shown, 3 images · IV contrast (APPLIED)
Comparison: 05/31/2020

CLINICAL DATA: 69-year-old male with a history of aortic aneurysm

EXAM:
CT ANGIOGRAPHY CHEST WITH CONTRAST
TECHNIQUE: Multidetector CT imaging of the chest was performed using the
standard protocol during bolus administration of intravenous
contrast. Multiplanar CT image reconstructions and MIPs were
obtained to evaluate the vascular anatomy.
CONTRAST:  80mL OMNIPAQUE IOHEXOL 350 MG/ML SOLN

[Series 10: arterial thins 31 % · axial · arterial · 0.77mm/px · z∈[+1255,+1352]mm · 2 of 483 slices shown, 3 images]
[im 161/483  soft-tissue]
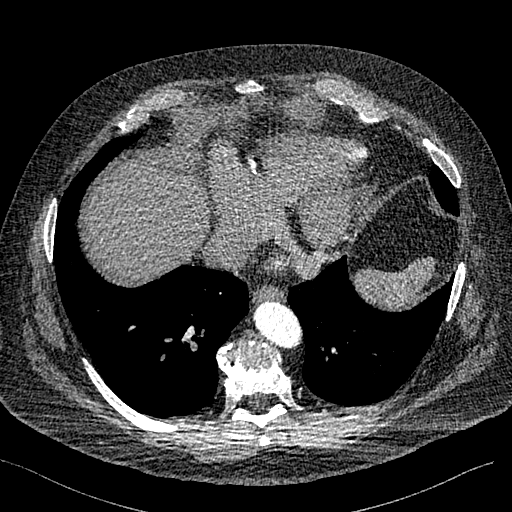
[im 161/483  bone]
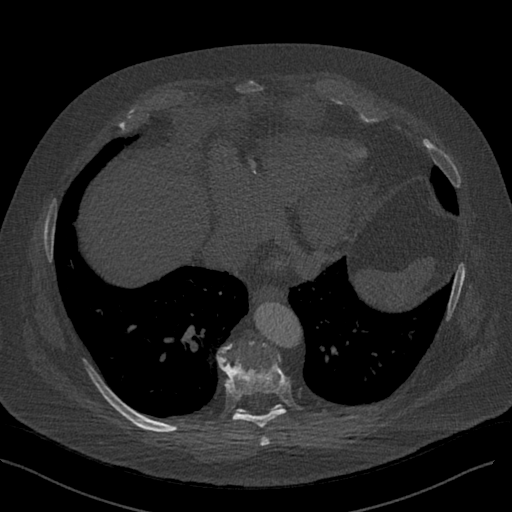
[im 322/483  soft-tissue]
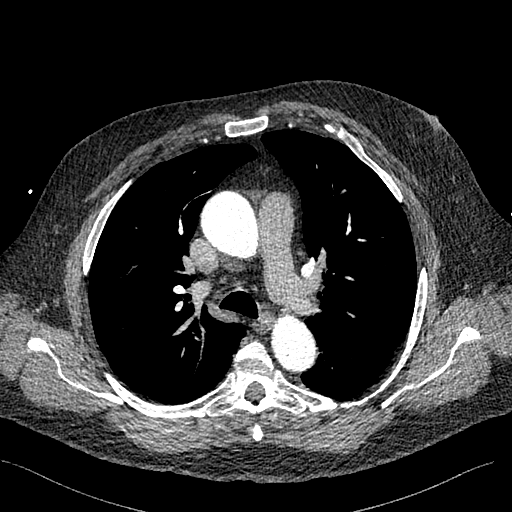

[2 of 16 positions shown; findings below may reference images not displayed]

FINDINGS: Cardiovascular:

Heart:

Cardiomegaly. No pericardial fluid/thickening. Calcifications left
anterior descending, circumflex, right coronary arteries.,

Aorta:

No significant aortic valve calcifications. Greatest estimated
diameter of the ascending aorta 4.4 cm.

Mild atherosclerosis of the aortic arch and the descending thoracic
aorta. Four vessel arch with separate origin of the left vertebral
artery. Cervical cerebral vessels patent at the base of the neck.

Pulmonary arteries:

Timing of the contrast bolus is not optimized for evaluation of
pulmonary artery filling defects.

Mediastinum/Nodes: No mediastinal adenopathy. Unremarkable
appearance of the thoracic esophagus.

Unremarkable appearance of the thoracic inlet.

Lungs/Pleura: Central airways are clear. No pleural effusion. No
confluent airspace disease.

No pneumothorax.  Mild scarring/atelectasis at the lung bases

Upper Abdomen: No acute.

Musculoskeletal: No acute displaced fracture. Degenerative changes
of the spine.

Review of the MIP images confirms the above findings.

.
IMPRESSION: Greatest diameter ascending aorta estimated 4.4 cm on the current
CT. Recommend annual imaging followup by CTA or MRA. This
recommendation follows 5040
ACCF/AHA/AATS/ACR/ASA/SCA/ARIEL FABIAN/MELYNDA/NINI/DOSHAN Guidelines for the
Diagnosis and Management of Patients with Thoracic Aortic Disease.
Circulation. 5040; 121: E266-e369. Aortic aneurysm NOS (YHIUO-3HN.5)

Mild aortic atherosclerosis and associated coronary artery disease.
Aortic Atherosclerosis (YHIUO-T32.2).

## 2022-08-08 DIAGNOSIS — Z0001 Encounter for general adult medical examination with abnormal findings: Secondary | ICD-10-CM | POA: Diagnosis not present

## 2022-11-17 ENCOUNTER — Other Ambulatory Visit (HOSPITAL_COMMUNITY): Payer: Self-pay

## 2022-11-20 DIAGNOSIS — R7301 Impaired fasting glucose: Secondary | ICD-10-CM | POA: Diagnosis not present

## 2022-11-20 DIAGNOSIS — D696 Thrombocytopenia, unspecified: Secondary | ICD-10-CM | POA: Diagnosis not present

## 2022-11-20 DIAGNOSIS — I5032 Chronic diastolic (congestive) heart failure: Secondary | ICD-10-CM | POA: Diagnosis not present

## 2022-11-20 DIAGNOSIS — I700 Atherosclerosis of aorta: Secondary | ICD-10-CM | POA: Diagnosis not present

## 2022-11-20 DIAGNOSIS — I728 Aneurysm of other specified arteries: Secondary | ICD-10-CM | POA: Diagnosis not present

## 2023-01-08 ENCOUNTER — Encounter: Payer: Self-pay | Admitting: Surgery

## 2023-01-08 ENCOUNTER — Ambulatory Visit: Payer: Medicare Other | Admitting: Surgery

## 2023-01-08 VITALS — BP 158/95 | HR 74 | Temp 97.9°F | Resp 20 | Ht 72.0 in | Wt 287.0 lb

## 2023-01-08 DIAGNOSIS — I728 Aneurysm of other specified arteries: Secondary | ICD-10-CM

## 2023-01-08 NOTE — Progress Notes (Signed)
[facility] [place]  Patient name: Anthony Malone MRN: [id number] DOB: 04/10/1951 Sex: male   REQUESTING PROVIDER:    Dr. Docia Chuck   REASON FOR CONSULT:    Splenic artery aneurysm  HISTORY OF PRESENT ILLNESS:   Anthony Malone is a 72 y.o. 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  The patient has a known ascending aortic aneurysm.  He is medically managed for hypertension.  He takes a statin for hypercholesterolemia.  He is a non-smoker.  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    Mild CAD    Pulmonary nodule      FAMILY HISTORY   Family History  Problem Relation Age of Onset   Hypertension Mother    Breast cancer Mother    Hypertension Father    CVA Father     SOCIAL HISTORY:   Social History   Socioeconomic History   Marital status: Married    Spouse name: Not on file   Number of children: Not on file   Years of education: Not on file   Highest education level: Not on file  Occupational History   Not on file  Tobacco Use   Smoking status: Never   Smokeless tobacco: Never  Vaping Use   Vaping status: Never Used  Substance and Sexual Activity   Alcohol use: Yes    Comment: rare   Drug use: No   Sexual activity: Not on file  Other Topics Concern   Not on file  Social History Narrative   Not on file   Social Determinants of Health   Financial Resource Strain: Not on file  Food  Insecurity: No Food Insecurity (04/30/2022)   Hunger Vital Sign    Worried About Running Out of Food in the Last Year: Never true    Ran Out of Food in the Last Year: Never true  Transportation Needs: No Transportation Needs (04/30/2022)   PRAPARE - Administrator, Civil Service (Medical): No    Lack of Transportation (Non-Medical): No  Physical Activity: Not on file  Stress: Not on file  Social Connections: Unknown (09/11/2021)   [facility]   Social Network    Social Network: Not on file  Intimate Partner Violence: Not At Risk (04/30/2022)   Humiliation, Afraid, Rape, and Kick questionnaire    Fear of Current or Ex-Partner: No    Emotionally Abused: No    Physically Abused: No    Sexually Abused: No    ALLERGIES:    Allergies  Allergen Reactions   Ibuprofen Rash    High dosages causes rash    CURRENT MEDICATIONS:    Current Outpatient Medications  Medication Sig Dispense Refill   albuterol (VENTOLIN HFA) 108 (90 Base) MCG/ACT inhaler Inhale 2 puffs into the lungs every 6 (six) hours as needed for wheezing or  shortness of breath. 6.7 g 0   amLODipine (NORVASC) 5 MG tablet Take 1 tablet by mouth once daily 90 tablet 3   aspirin 81 MG tablet Take 81 mg by mouth daily.     chlorthalidone (HYGROTON) 50 MG tablet Take 1 tablet by mouth once daily 90 tablet 3   doxazosin (CARDURA) 2 MG tablet TAKE 1 TABLET BY MOUTH AT BEDTIME 90 tablet 3   losartan (COZAAR) 100 MG tablet Take 1 tablet by mouth once daily 90 tablet 3   metoprolol succinate (TOPROL-XL) 25 MG 24 hr tablet Take 1 tablet by mouth once daily 90 tablet 3   naproxen (NAPROSYN) 500 MG tablet Take 500 mg by mouth as needed for mild pain or moderate pain (joint pain).     rosuvastatin (CRESTOR) 5 MG tablet TAKE 1 TABLET BY MOUTH ONCE DAILY . APPOINTMENT REQUIRED FOR FUTURE REFILLS 90 tablet 3   TART CHERRY PO Take 500 mg by mouth 2 (two) times daily.     No current facility-administered  medications for this visit.    REVIEW OF SYSTEMS:   [X]  denotes positive finding, [ ]  denotes negative finding Cardiac  Comments:  Chest pain or chest pressure:    Shortness of breath upon exertion:    Short of breath when lying flat:    Irregular heart rhythm:        Vascular    Pain in calf, thigh, or hip brought on by ambulation:    Pain in feet at night that wakes you up from your sleep:     Blood clot in your veins:    Leg swelling:         Pulmonary    Oxygen at home:    Productive cough:     Wheezing:         Neurologic    Sudden weakness in arms or legs:     Sudden numbness in arms or legs:     Sudden onset of difficulty speaking or slurred speech:    Temporary loss of vision in one eye:     Problems with dizziness:         Gastrointestinal    Blood in stool:      Vomited blood:         Genitourinary    Burning when urinating:     Blood in urine:        Psychiatric    Major depression:         Hematologic    Bleeding problems:    Problems with blood clotting too easily:        Skin    Rashes or ulcers:        Constitutional    Fever or chills:     PHYSICAL EXAM:   Vitals:   01/08/23 0815  BP: (!) 158/95  Pulse: 74  Resp: 20  Temp: 97.9 F (36.6 C)  SpO2: 97%  Weight: 287 lb (130.2 kg)  Height: 6' (1.829 m)    GENERAL: The patient is a well-nourished male, in no acute distress. The vital signs are documented above. CARDIAC: There is a regular rate and rhythm.  PULMONARY: Nonlabored respirations ABDOMEN: Soft and non-tender  MUSCULOSKELETAL: There are no major deformities or cyanosis. NEUROLOGIC: No focal weakness or paresthesias are detected. SKIN: There are no ulcers or rashes noted. PSYCHIATRIC: The patient has a normal affect.  STUDIES:   I have reviewed the following CTA: 1. Mild fusiform aneurysmal dilation of the tubular portion  of the ascending thoracic aorta with a maximal diameter of 4.0 cm. Prior measurements of 4.4 cm  were likely exaggerated and may not have accounted for the underlying tortuosity. Recommend annual imaging followup by CTA or MRA. This recommendation follows 2010 ACCF/AHA/AATS/ACR/ASA/SCA/SCAI/SIR/STS/SVM Guidelines for the Diagnosis and Management of Patients with Thoracic Aortic Disease. Circulation. 2010; 121: D638-V564. 2. Aortic and coronary artery atherosclerotic vascular calcifications. 3. Stable mild fusiform aneurysmal dilation of the splenic artery to a maximal diameter of 1.2 cm. 4. Additional ancillary findings as above without interval change.    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  This is heavily calcified, implying that this has been present for a long time.  I told him that I would not recommend repair until this was at least 2 cm.  I do think this needs to be followed with serial exams.  I will plan on having him return in 1 year for a ultrasound.  At that time, we will also evaluate his infrarenal aorta, as he has never had this looked at.   Charlena Cross, MD, FACS [facility] [phone] Pager [phone]

## 2023-01-16 ENCOUNTER — Other Ambulatory Visit: Payer: Self-pay

## 2023-01-16 DIAGNOSIS — I728 Aneurysm of other specified arteries: Secondary | ICD-10-CM

## 2023-02-07 DIAGNOSIS — K080 Exfoliation of teeth due to systemic causes: Secondary | ICD-10-CM | POA: Diagnosis not present

## 2023-05-15 DIAGNOSIS — D696 Thrombocytopenia, unspecified: Secondary | ICD-10-CM | POA: Diagnosis not present

## 2023-05-15 DIAGNOSIS — I5032 Chronic diastolic (congestive) heart failure: Secondary | ICD-10-CM | POA: Diagnosis not present

## 2023-05-15 DIAGNOSIS — Z23 Encounter for immunization: Secondary | ICD-10-CM | POA: Diagnosis not present

## 2023-05-15 DIAGNOSIS — E7800 Pure hypercholesterolemia, unspecified: Secondary | ICD-10-CM | POA: Diagnosis not present

## 2023-05-15 DIAGNOSIS — Z0001 Encounter for general adult medical examination with abnormal findings: Secondary | ICD-10-CM | POA: Diagnosis not present

## 2023-05-15 DIAGNOSIS — I10 Essential (primary) hypertension: Secondary | ICD-10-CM | POA: Diagnosis not present

## 2023-05-15 DIAGNOSIS — I700 Atherosclerosis of aorta: Secondary | ICD-10-CM | POA: Diagnosis not present

## 2023-05-15 DIAGNOSIS — R7301 Impaired fasting glucose: Secondary | ICD-10-CM | POA: Diagnosis not present

## 2023-05-15 DIAGNOSIS — Z1331 Encounter for screening for depression: Secondary | ICD-10-CM | POA: Diagnosis not present

## 2023-05-15 LAB — LAB REPORT - SCANNED
A1c: 5.2
EGFR: 87

## 2023-05-25 ENCOUNTER — Other Ambulatory Visit: Payer: Self-pay | Admitting: Cardiology

## 2023-06-13 ENCOUNTER — Ambulatory Visit: Payer: Medicare Other | Attending: Cardiology | Admitting: Cardiology

## 2023-06-13 ENCOUNTER — Telehealth: Payer: Self-pay

## 2023-06-13 ENCOUNTER — Encounter: Payer: Self-pay | Admitting: Cardiology

## 2023-06-13 VITALS — BP 132/84 | HR 59 | Ht 72.0 in | Wt 266.6 lb

## 2023-06-13 DIAGNOSIS — I2510 Atherosclerotic heart disease of native coronary artery without angina pectoris: Secondary | ICD-10-CM | POA: Diagnosis not present

## 2023-06-13 DIAGNOSIS — E7800 Pure hypercholesterolemia, unspecified: Secondary | ICD-10-CM

## 2023-06-13 DIAGNOSIS — I10 Essential (primary) hypertension: Secondary | ICD-10-CM

## 2023-06-13 DIAGNOSIS — I493 Ventricular premature depolarization: Secondary | ICD-10-CM | POA: Diagnosis not present

## 2023-06-13 DIAGNOSIS — R600 Localized edema: Secondary | ICD-10-CM

## 2023-06-13 DIAGNOSIS — I2583 Coronary atherosclerosis due to lipid rich plaque: Secondary | ICD-10-CM

## 2023-06-13 DIAGNOSIS — I77810 Thoracic aortic ectasia: Secondary | ICD-10-CM

## 2023-06-13 NOTE — Patient Instructions (Signed)
Medication Instructions:   *If you need a refill on your cardiac medications before your next appointment, please call your pharmacy*   Lab Work: CMET, LIPID   [facility] Contact for Alternative locations and appointment scheduling   [email or web address]   [email or web address]  [phone]      If you have labs (blood work) drawn today and your tests are completely normal, you will receive your results only by: MyChart Message (if you have MyChart) OR A paper copy in the mail If you have any lab test that is abnormal or we need to change your treatment, we will call you to review the results.   Testing/Procedures:    Follow-Up: At [facility], you and your health needs are our priority.  As part of our continuing mission to provide you with exceptional heart care, we have created designated Provider Care Teams.  These Care Teams include your primary Cardiologist (physician) and Advanced Practice Providers (APPs -  Physician Assistants and Nurse Practitioners) who all work together to provide you with the care you need, when you need it.  We recommend signing up for the patient portal called "MyChart".  Sign up information is provided on this After Visit Summary.  MyChart is used to connect with patients for Virtual Visits (Telemedicine).  Patients are able to view lab/test results, encounter notes, upcoming appointments, etc.  Non-urgent messages can be sent to your provider as well.   To learn more about what you can do with MyChart, go to [email or web address].    Your next appointment:   1 year(s)  Provider:   Armanda Magic, MD     Other Instructions

## 2023-06-13 NOTE — Addendum Note (Signed)
Addended by: Bertram Millard on: 06/13/2023 09:00 AM   Modules accepted: Orders

## 2023-06-13 NOTE — Telephone Encounter (Signed)
Pt saw Dr Mayford Knife today and she had ordered a CMET and LIPID panel.... pt says he had done less than a month ago at his PCP and he was able to pull upon his phone to verify that it was these labs.. I will contact Dr Docia Chuck and have the results sent to Dr Mayford Knife.

## 2023-06-13 NOTE — Progress Notes (Addendum)
Cardiology Office Note:    Date:  06/13/2023   ID:  Anthony Malone, DOB 07/29/1950, MRN [id number]  PCP:  Darrow Bussing, MD  Cardiologist:  Anthony Magic, MD    Referring MD: Darrow Bussing, MD   Chief Complaint  Patient presents with   Coronary Artery Disease   Hypertension   Hyperlipidemia    History of Present Illness:    Anthony Malone is a 73 y.o. male with a hx of HTN and chest pain.  He had a stress test for some exertional fatigue which was normal.  Coronary CTA was done in January 2022 for atypical symptoms showing a coronary calcium score 323.  There was mild CAD of the RCA and proximal and mid LAD with 25 to 49% proximal RCA stenosis and proximal LAD stenosis with normal FFR.  Nuclear stress test 05/2022 showed no ischemia.  he is here today for followup and is doing well.  He denies any chest pain or pressure, SOB, DOE, PND, orthopnea, LE edema, dizziness, palpitations or syncope. He does say that if he jumps up fast to do something he seems to get more fatigue but no more than a year ago when we did the stress test. He works out in the yard with no problems if he does not overexert himself.  He thinks it has to do with what activity he is doing.  He is compliant with his meds and is tolerating meds with no SE.    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 No pertinent surgical history.  Current Medications: Current Meds  Medication Sig   amLODipine (NORVASC) 5 MG tablet Take 1 tablet (5 mg total) by mouth daily.  Please keep upcoming appointment with Dr. Mayford Knife on 2/12 in order to receive future refills. Thank You.   aspirin 81 MG tablet Take 81 mg by mouth daily.   chlorthalidone (HYGROTON) 50 MG tablet Take 1 tablet by mouth once daily   doxazosin (CARDURA) 2 MG tablet TAKE 1 TABLET BY MOUTH AT BEDTIME   losartan (COZAAR) 100 MG tablet Take 1 tablet by mouth once daily   metoprolol succinate (TOPROL-XL) 25 MG 24 hr tablet Take 1 tablet by mouth once daily   naproxen (NAPROSYN) 500 MG tablet Take 500 mg by mouth as needed for mild pain or moderate pain (joint pain).   rosuvastatin (CRESTOR) 5 MG tablet Take 1 tablet (5 mg total) by mouth daily.   TART CHERRY PO Take 500 mg by mouth 2 (two) times daily.     Allergies:   Ibuprofen   Social History   Socioeconomic History   Marital status: Married    Spouse name: Not on file   Number of children: Not on file   Years of education: Not on file   Highest education level: Not on file  Occupational History   Not on file  Tobacco Use  Smoking status: Never   Smokeless tobacco: Never  Vaping Use   Vaping status: Never Used  Substance and Sexual Activity   Alcohol use: Yes    Comment: rare   Drug use: No   Sexual activity: Not on file  Other Topics Concern   Not on file  Social History Narrative   Not on file   Social Drivers of Health   Financial Resource Strain: Not on file  Food Insecurity: No Food Insecurity (04/30/2022)   Hunger Vital Sign    Worried About Running Out of Food in the Last Year: Never true    Ran Out of Food in the Last Year: Never true  Transportation Needs: No Transportation Needs (04/30/2022)   PRAPARE - Administrator, Civil Service (Medical): No    Lack of Transportation (Non-Medical): No  Physical Activity: Not on file  Stress: Not on file  Social Connections: Unknown (09/11/2021)   Received from [facility]   Social Network    Social Network: Not on file     Family  History: The patient's family history includes Breast cancer in his mother; CVA in his father; Hypertension in his father and mother.  ROS:   Please see the history of present illness.    ROS  All other systems reviewed and negative.   EKGs/Labs/Other Studies Reviewed:    The following studies were reviewed today: EKG Interpretation Date/Time:  Wednesday June 13 2023 08:41:43 EST Ventricular Rate:  59 PR Interval:  196 QRS Duration:  118 QT Interval:  414 QTC Calculation: 409 R Axis:   -61  Text Interpretation: Sinus bradycardia with sinus arrhythmia with occasional Premature ventricular complexes Left anterior fascicular block Minimal voltage criteria for LVH, may be normal variant ( Cornell product ) When compared with ECG of 29-Apr-2022 05:15, PREVIOUS ECG IS PRESENT Confirmed by Anthony Malone ([id number]) on 06/13/2023 8:47:05 AM    Recent Labs: No results found for requested labs within last 365 days.   Recent Lipid Panel    Component Value Date/Time   CHOL 99 (L) 05/10/2022 0909   TRIG 110 05/10/2022 0909   HDL 37 (L) 05/10/2022 0909   CHOLHDL 2.7 05/10/2022 0909   LDLCALC 42 05/10/2022 0909    Physical Exam:    VS:  BP 132/84   Pulse (!) 59   Ht 6' (1.829 m)   Wt 266 lb 9.6 oz (120.9 kg)   SpO2 99%   BMI 36.16 kg/m     Wt Readings from Last 3 Encounters:  06/13/23 266 lb 9.6 oz (120.9 kg)  01/08/23 287 lb (130.2 kg)  05/17/22 287 lb (130.2 kg)    GEN: Well nourished, well developed in no acute distress HEENT: Normal NECK: No JVD; No carotid bruits LYMPHATICS: No lymphadenopathy CARDIAC:RRR, no murmurs, rubs, gallops RESPIRATORY:  Clear to auscultation without rales, wheezing or rhonchi  ABDOMEN: Soft, non-tender, non-distended MUSCULOSKELETAL:  trace edema; No deformity  SKIN: Warm and dry NEUROLOGIC:  Alert and oriented x 3 PSYCHIATRIC:  Normal affect  ASSESSMENT:    1. Essential hypertension, benign   2. Pure hypercholesterolemia   3. PVC's  (premature ventricular contractions)   4. Coronary artery disease due to lipid rich plaque   5. Acquired dilation of ascending aorta and aortic root (HCC)     PLAN:    In order of problems listed above:  #HTN -BP is controlled on exam today -Continue prescription drug managed with amlodipine 5 mg daily, chlorthalidone 50 mg  daily, doxazosin 2 mg nightly, losartan 100 mg daily, Toprol-XL 25 mg daily with as needed refills -check bmet  2.  HLD -LDL goal < 70  -Check FLP and ALT -Continue prescription drug management with Crestor 5 mg daily with as needed refills  3.  PVCs -Noted on EKG in the past -He denies any palpitations since I saw him last  4.  ASCAD -Coronary CTA was done in January 2022 for atypical symptoms showing a coronary calcium score 323.  There was mild CAD of the RCA and proximal and mid LAD with 25 to 49% proximal RCA stenosis and proximal LAD stenosis with normal FFR. -At last office visit 2024 he was complaining of fatigue with exertion and nuclear stress test showed no ischemia with EF 59% -He denies any anginal symptoms -continue prescription drug management with aspirin 81 mg daily, amlodipine 5 mg daily, Toprol-XL 25 mg daily Crestor 5 mg daily with as needed refills  5.  Dilated aorta -chest CTA  2022 showed ascending aorta 4.4cm -repeat chest CTA 05/2022 showed a measurement of 4 cm -BP is adequately controlled  6.  LE edema -trace to mild -likely due to dietary indiscretion with na -encouraged him to cut out processed foods and meats    Medication Adjustments/Labs and Tests Ordered: Current medicines are reviewed at length with the patient today.  Concerns regarding medicines are outlined above.  Orders Placed This Encounter  Procedures   EKG 12-Lead    No orders of the defined types were placed in this encounter.   Signed, Anthony Magic, MD  06/13/2023 8:50 AM    [facility]

## 2023-06-18 NOTE — Telephone Encounter (Signed)
 Call to patient to advise that Dr. Mayford Knife reviewed outside labs and states they are great. No answer on either # listed on DPR, no answering machine/voice mail picking up. MyChart Message sent.

## 2023-06-19 DIAGNOSIS — Z0001 Encounter for general adult medical examination with abnormal findings: Secondary | ICD-10-CM | POA: Diagnosis not present

## 2023-08-15 DIAGNOSIS — K080 Exfoliation of teeth due to systemic causes: Secondary | ICD-10-CM | POA: Diagnosis not present

## 2023-08-23 ENCOUNTER — Other Ambulatory Visit (HOSPITAL_COMMUNITY): Payer: Self-pay

## 2023-08-28 ENCOUNTER — Other Ambulatory Visit: Payer: Self-pay | Admitting: Cardiology

## 2023-10-16 ENCOUNTER — Other Ambulatory Visit (HOSPITAL_COMMUNITY): Payer: Self-pay

## 2023-10-29 DIAGNOSIS — M109 Gout, unspecified: Secondary | ICD-10-CM | POA: Diagnosis not present

## 2023-11-27 DIAGNOSIS — I5032 Chronic diastolic (congestive) heart failure: Secondary | ICD-10-CM | POA: Diagnosis not present

## 2023-11-27 DIAGNOSIS — I728 Aneurysm of other specified arteries: Secondary | ICD-10-CM | POA: Diagnosis not present

## 2023-11-27 DIAGNOSIS — I10 Essential (primary) hypertension: Secondary | ICD-10-CM | POA: Diagnosis not present

## 2023-11-27 DIAGNOSIS — M109 Gout, unspecified: Secondary | ICD-10-CM | POA: Diagnosis not present

## 2023-11-27 DIAGNOSIS — D696 Thrombocytopenia, unspecified: Secondary | ICD-10-CM | POA: Diagnosis not present

## 2023-12-14 ENCOUNTER — Other Ambulatory Visit: Payer: Self-pay

## 2023-12-14 DIAGNOSIS — I728 Aneurysm of other specified arteries: Secondary | ICD-10-CM

## 2024-01-28 ENCOUNTER — Ambulatory Visit (HOSPITAL_COMMUNITY)
Admission: RE | Admit: 2024-01-28 | Discharge: 2024-01-28 | Disposition: A | Source: Ambulatory Visit | Attending: Surgery | Admitting: Surgery

## 2024-01-28 ENCOUNTER — Ambulatory Visit: Admitting: Surgery

## 2024-01-28 ENCOUNTER — Ambulatory Visit (HOSPITAL_COMMUNITY)
Admission: RE | Admit: 2024-01-28 | Discharge: 2024-01-28 | Disposition: A | Discharge status: Home / Self Care | Source: Ambulatory Visit | Attending: Surgery | Admitting: Surgery

## 2024-01-28 ENCOUNTER — Encounter: Payer: Self-pay | Admitting: Surgery

## 2024-01-28 VITALS — BP 172/88 | HR 53 | Temp 97.9°F | Ht 72.0 in | Wt 266.0 lb

## 2024-01-28 DIAGNOSIS — I728 Aneurysm of other specified arteries: Secondary | ICD-10-CM

## 2024-01-28 NOTE — Progress Notes (Signed)
 [facility] [place]  Patient name: Anthony Malone MRN: [id number] DOB: [birth year] Sex: male   REASON FOR VISIT:    Follow up  HISOTRY OF PRESENT ILLNESS:    Anthony Malone is a 73 y.o. 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  Maximum diameter was 1.2 cm.  It was heavily calcified.  He has a known ascending aortic aneurysm.  He is medically managed for hypertension.  He takes a statin for hypercholesterolemia.  He is a non-smoker.  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    Mild CAD    Pulmonary nodule      FAMILY HISTORY:   Family History  Problem Relation Age of Onset   Hypertension Mother    Breast cancer Mother    Hypertension Father    CVA Father     SOCIAL HISTORY:   Social History   Tobacco Use   Smoking status: Never   Smokeless tobacco: Never  Substance Use Topics   Alcohol use: Yes    Comment: rare     ALLERGIES:   Allergies  Allergen Reactions   Ibuprofen Rash    High dosages causes rash     CURRENT MEDICATIONS:   Current Outpatient Medications  Medication Sig Dispense Refill   amLODipine  (NORVASC ) 5 MG tablet Take 1 tablet (5 mg total) by mouth daily. 90 tablet 3   aspirin  81 MG tablet Take 81 mg by mouth daily.     chlorthalidone  (HYGROTON ) 50 MG tablet Take 1 tablet (50 mg total) by mouth daily. 90 tablet 3   doxazosin  (CARDURA ) 2 MG tablet  TAKE 1 TABLET BY MOUTH AT BEDTIME 90 tablet 3   losartan  (COZAAR ) 100 MG tablet Take 1 tablet (100 mg total) by mouth daily. 90 tablet 3   metoprolol  succinate (TOPROL -XL) 25 MG 24 hr tablet Take 1 tablet by mouth once daily 90 tablet 3   naproxen (NAPROSYN) 500 MG tablet Take 500 mg by mouth as needed for mild pain or moderate pain (joint pain).     rosuvastatin  (CRESTOR ) 5 MG tablet Take 1 tablet (5 mg total) by mouth daily. 90 tablet 3   TART CHERRY PO Take 500 mg by mouth 2 (two) times daily.     No current facility-administered medications for this visit.    REVIEW OF SYSTEMS:   [X]  denotes positive finding, [ ]  denotes negative finding Cardiac  Comments:  Chest pain or chest pressure:    Shortness of breath upon exertion:    Short of breath when lying flat:    Irregular heart rhythm:        Vascular    Pain in calf, thigh, or hip brought on by ambulation:    Pain in feet at night that wakes you up from your sleep:     Blood  clot in your veins:    Leg swelling:         Pulmonary    Oxygen at home:    Productive cough:     Wheezing:         Neurologic    Sudden weakness in arms or legs:     Sudden numbness in arms or legs:     Sudden onset of difficulty speaking or slurred speech:    Temporary loss of vision in one eye:     Problems with dizziness:         Gastrointestinal    Blood in stool:     Vomited blood:         Genitourinary    Burning when urinating:     Blood in urine:        Psychiatric    Major depression:         Hematologic    Bleeding problems:    Problems with blood clotting too easily:        Skin    Rashes or ulcers:        Constitutional    Fever or chills:      PHYSICAL EXAM:   Vitals:   01/28/24 1111  BP: (!) 172/88  Pulse: (!) 53  Temp: 97.9 F (36.6 C)  SpO2: 96%  Weight: 266 lb (120.7 kg)  Height: 6' (1.829 m)    GENERAL: The patient is a well-nourished male, in no acute distress. The vital signs are documented  above. CARDIAC: There is a regular rate and rhythm.  PULMONARY: Non-labored respirations ABDOMEN: Soft and non-tender  MUSCULOSKELETAL: There are no major deformities or cyanosis. NEUROLOGIC: No focal weakness or paresthesias are detected. SKIN: There are no ulcers or rashes noted. PSYCHIATRIC: The patient has a normal affect.  STUDIES:   I have reviewed the following: CTA: 1. Mild fusiform aneurysmal dilation of the tubular portion of the ascending thoracic aorta with a maximal diameter of 4.0 cm. Prior measurements of 4.4 cm were likely exaggerated and may not have accounted for the underlying tortuosity. Recommend annual imaging followup by CTA or MRA. This recommendation follows 2010 ACCF/AHA/AATS/ACR/ASA/SCA/SCAI/SIR/STS/SVM Guidelines for the Diagnosis and Management of Patients with Thoracic Aortic Disease. Circulation. 2010; 121: Z733-z630. 2. Aortic and coronary artery atherosclerotic vascular calcifications. 3. Stable mild fusiform aneurysmal dilation of the splenic artery to a maximal diameter of 1.2 cm. 4. Additional ancillary findings as above without interval change.   Mesenteric ultrasound: Mesenteric:  Normal Celiac artery , Superior Mesenteric artery, Inferior Mesenteric  artery,  Splenic artery and Hepatic artery findings.  Probable splenic artery aneurysm near hilum, measuring 1.2 cm.     Aorta: Abdominal Aorta: No evidence of an abdominal aortic aneurysm was  visualized. The largest aortic measurement is 2.5 cm. No previous exam  available for comparison.   IVC/Iliac: Patent IVC.  MEDICAL ISSUES:   Splenic artery aneurysm: Maximal diameter is 1.2 cm by both CT scan and ultrasound.  This has not had any interval change over the past year.  I think we can extend our interval for follow-up.  He will return in 2 years with an ultrasound.  I would not consider repair until his at least 2 cm    Malvina New, IV, MD, FACS [facility] [phone] Pager [phone]

## 2024-02-26 DIAGNOSIS — K080 Exfoliation of teeth due to systemic causes: Secondary | ICD-10-CM | POA: Diagnosis not present

## 2024-06-02 ENCOUNTER — Other Ambulatory Visit: Payer: Self-pay

## 2024-08-04 ENCOUNTER — Ambulatory Visit: Admitting: Cardiology
